# Patient Record
Sex: Male | Born: 1960 | Race: White | Hispanic: No | Marital: Married | Smoking: Former smoker
Health system: Southern US, Community
[De-identification: ages and names within clinical notes are randomized; demographics above are authoritative.]

## PROBLEM LIST (undated history)

## (undated) DIAGNOSIS — F32A Depression, unspecified: Secondary | ICD-10-CM

## (undated) DIAGNOSIS — I10 Essential (primary) hypertension: Secondary | ICD-10-CM

## (undated) DIAGNOSIS — K219 Gastro-esophageal reflux disease without esophagitis: Secondary | ICD-10-CM

## (undated) DIAGNOSIS — N189 Chronic kidney disease, unspecified: Secondary | ICD-10-CM

## (undated) DIAGNOSIS — G4730 Sleep apnea, unspecified: Secondary | ICD-10-CM

## (undated) DIAGNOSIS — F329 Major depressive disorder, single episode, unspecified: Secondary | ICD-10-CM

## (undated) DIAGNOSIS — K635 Polyp of colon: Secondary | ICD-10-CM

## (undated) DIAGNOSIS — E785 Hyperlipidemia, unspecified: Secondary | ICD-10-CM

## (undated) DIAGNOSIS — I4891 Unspecified atrial fibrillation: Secondary | ICD-10-CM

## (undated) HISTORY — DX: Hyperlipidemia, unspecified: E78.5

## (undated) HISTORY — DX: Chronic kidney disease, unspecified: N18.9

## (undated) HISTORY — DX: Polyp of colon: K63.5

## (undated) HISTORY — PX: APPENDECTOMY: SHX54

## (undated) HISTORY — DX: Sleep apnea, unspecified: G47.30

## (undated) HISTORY — PX: COLONOSCOPY: SHX174

## (undated) HISTORY — DX: Unspecified atrial fibrillation: I48.91

## (undated) HISTORY — PX: POLYPECTOMY: SHX149

## (undated) HISTORY — DX: Major depressive disorder, single episode, unspecified: F32.9

## (undated) HISTORY — DX: Depression, unspecified: F32.A

## (undated) HISTORY — DX: Essential (primary) hypertension: I10

---

## 2000-03-23 ENCOUNTER — Emergency Department (HOSPITAL_COMMUNITY): Admission: EM | Admit: 2000-03-23 | Discharge: 2000-03-23 | Payer: Self-pay | Admitting: Emergency Medicine

## 2001-03-03 ENCOUNTER — Encounter: Payer: Self-pay | Admitting: Emergency Medicine

## 2001-03-03 ENCOUNTER — Emergency Department (HOSPITAL_COMMUNITY): Admission: EM | Admit: 2001-03-03 | Discharge: 2001-03-03 | Payer: Self-pay

## 2003-12-26 ENCOUNTER — Encounter: Admission: RE | Admit: 2003-12-26 | Discharge: 2003-12-26 | Payer: Self-pay | Admitting: Internal Medicine

## 2006-05-13 DIAGNOSIS — I10 Essential (primary) hypertension: Secondary | ICD-10-CM

## 2006-05-13 HISTORY — DX: Essential (primary) hypertension: I10

## 2007-07-01 ENCOUNTER — Ambulatory Visit: Payer: Self-pay | Admitting: Gastroenterology

## 2007-07-08 ENCOUNTER — Ambulatory Visit: Payer: Self-pay | Admitting: Gastroenterology

## 2007-07-08 ENCOUNTER — Encounter: Payer: Self-pay | Admitting: Gastroenterology

## 2007-07-26 ENCOUNTER — Emergency Department (HOSPITAL_COMMUNITY): Admission: EM | Admit: 2007-07-26 | Discharge: 2007-07-26 | Payer: Self-pay | Admitting: Family Medicine

## 2008-08-30 ENCOUNTER — Encounter: Admission: RE | Admit: 2008-08-30 | Discharge: 2008-08-30 | Payer: Self-pay | Admitting: Orthopedic Surgery

## 2010-06-06 ENCOUNTER — Encounter: Payer: Self-pay | Admitting: Gastroenterology

## 2010-06-14 NOTE — Letter (Signed)
Summary: Colonoscopy Letter  [facility]  [address] [place], [place] [address]   Phone: [phone]  Fax: [phone]      June 06, 2010 MRN: [id number]   Norman Sims [address] [place], [place]  [address]   Dear [name],   According to your medical record, it is time for you to schedule a Colonoscopy. The American Cancer Society recommends this procedure as a method to detect early colon cancer. Patients with a family history of colon cancer, or a personal history of colon polyps or inflammatory bowel disease are at increased risk.  This letter has been generated based on the recommendations made at the time of your procedure. If you feel that in your particular situation this may no longer apply, please contact our office.  Please call our office at [phone] to schedule this appointment or to update your records at your earliest convenience.  Thank you for cooperating with [place] to provide you with the very best care possible.   Sincerely,  Judie Petit T. Russella Dar, M.D.  [facility] Gastroenterology Division [phone]

## 2010-07-09 ENCOUNTER — Encounter (INDEPENDENT_AMBULATORY_CARE_PROVIDER_SITE_OTHER): Payer: Self-pay | Admitting: *Deleted

## 2010-07-19 NOTE — Letter (Signed)
Summary: Pre Visit Letter Revised  [facility]  [address] [place], [place] [address]   Phone: [phone]  Fax: [phone]        07/09/2010 MRN: [id number] Norman Sims [address] [place], [place]  [address]             Procedure Date:  08/14/2010 @ 9:30   Recall colon-Dr. Russella Dar   Welcome to the Gastroenterology Division at [facility].    You are scheduled to see a nurse for your pre-procedure visit on 07/31/2010 at 9:00 on the 3rd floor at [facility], [address]  We ask that you try to arrive at our office 15 minutes prior to your appointment time to allow for check-in.  Please take a minute to review the attached form.  If you answer "Yes" to one or more of the questions on the first page, we ask that you call the person listed at your earliest opportunity.  If you answer "No" to all of the questions, please complete the rest of the form and bring it to your appointment.    Your nurse visit will consist of discussing your medical and surgical history, your immediate family medical history, and your medications.   If you are unable to list all of your medications on the form, please bring the medication bottles to your appointment and we will list them.  We will need to be aware of both prescribed and over the counter drugs.  We will need to know exact dosage information as well.    Please be prepared to read and sign documents such as consent forms, a financial agreement, and acknowledgement forms.  If necessary, and with your consent, a friend or relative is welcome to sit-in on the nurse visit with you.  Please bring your insurance card so that we may make a copy of it.  If your insurance requires a referral to see a specialist, please bring your referral form from your primary care physician.  No co-pay is required for this nurse visit.     If you cannot keep your appointment, please call [phone] to cancel or reschedule prior to  your appointment date.  This allows [place] the opportunity to schedule an appointment for another patient in need of care.    Thank you for choosing [facility] for your medical needs.  We appreciate the opportunity to care for you.  Please visit [place] at our website  to learn more about our practice.  Sincerely, The Gastroenterology Division

## 2010-07-31 ENCOUNTER — Ambulatory Visit (INDEPENDENT_AMBULATORY_CARE_PROVIDER_SITE_OTHER): Payer: BC Managed Care – PPO

## 2010-07-31 VITALS — Ht 71.0 in | Wt 255.2 lb

## 2010-07-31 DIAGNOSIS — Z1211 Encounter for screening for malignant neoplasm of colon: Secondary | ICD-10-CM

## 2010-07-31 MED ORDER — PEG-KCL-NACL-NASULF-NA ASC-C 100 G PO SOLR: 1.0000 | Freq: Once | ORAL | Status: AC

## 2010-07-31 NOTE — Patient Instructions (Signed)
Pt to pick up prep in 5 days 

## 2010-07-31 NOTE — Progress Notes (Signed)
Hold HCTZ am of procedure.

## 2010-08-10 NOTE — Progress Notes (Signed)
Previsit was done on 07/31/10; instructions given to pt

## 2010-08-13 ENCOUNTER — Encounter: Payer: Self-pay | Admitting: Gastroenterology

## 2010-08-14 ENCOUNTER — Ambulatory Visit (AMBULATORY_SURGERY_CENTER): Payer: BC Managed Care – PPO | Admitting: Gastroenterology

## 2010-08-14 ENCOUNTER — Encounter: Payer: Self-pay | Admitting: Gastroenterology

## 2010-08-14 ENCOUNTER — Other Ambulatory Visit: Payer: Self-pay | Admitting: Gastroenterology

## 2010-08-14 VITALS — BP 132/85 | HR 79 | Temp 98.4°F | Resp 18 | Ht 72.0 in | Wt 250.0 lb

## 2010-08-14 DIAGNOSIS — K5730 Diverticulosis of large intestine without perforation or abscess without bleeding: Secondary | ICD-10-CM

## 2010-08-14 DIAGNOSIS — D126 Benign neoplasm of colon, unspecified: Secondary | ICD-10-CM

## 2010-08-14 DIAGNOSIS — Z1211 Encounter for screening for malignant neoplasm of colon: Secondary | ICD-10-CM

## 2010-08-14 DIAGNOSIS — Z86010 Personal history of colonic polyps: Secondary | ICD-10-CM

## 2010-08-14 NOTE — Patient Instructions (Signed)
Reviewed discharge instructions    Education material given on polyps, diverticulosis, high fiber diet, and hemorrhoids  High fiber diet with liberal fluid intake to be followed  Pathology results should arrive in approximately 2 weeks via letter

## 2010-08-15 ENCOUNTER — Telehealth: Payer: Self-pay | Admitting: *Deleted

## 2010-08-15 NOTE — Telephone Encounter (Signed)
Left message on cell phone for pt to call if any concerns or questions.

## 2010-08-21 ENCOUNTER — Encounter: Payer: Self-pay | Admitting: Gastroenterology

## 2010-09-25 NOTE — Assessment & Plan Note (Signed)
[facility]                         GASTROENTEROLOGY OFFICE NOTE   NAME:Plush, Norman Sims                   MRN:          [id number]  DATE:07/01/2007                            DOB:          12/01/1960    PROBLEM:  Rectal bleeding.   HISTORY:  This is a pleasant 50 year old white male with history of  hypertension, hyperlipidemia, and depression who has not had prior GI  problems.  He says he started noticing bright blood intermittently with  bowel movements 3-4 months ago.  This has persisted but does not occur  on a daily basis.  He has not noticed any changes in his bowel habits,  but says that he does have some sense at times of incomplete bowel  evacuation.  He has also noted some lower abdominal grumbling or  growling at times which he had never noticed previously.  He denies any  abdominal pain, cramping, bloating, etc.  He has had some internal  rectal irritation intermittently as well.  He describes the blood that  he sees as a casing of blood over the stool again intermittently.  His  appetite has been fine.  His weight has been stable.  He denies any  nausea, heartburn, or indigestion.   CURRENT MEDICATIONS:  1. Zoloft 50 mg daily.  2. Seroquel 400 mg daily.  3. Triamterene/hydrochlorothiazide 37.5/25 daily.  4. Benazepril 20 mg daily.  5. Lipitor 20 mg 1/2 tablet daily.  6. Multivitamin daily.  7. Vitamin E b.i.d.  8. Aspirin 81 mg daily.   ALLERGIES:  No known drug allergies.   PAST MEDICAL HISTORY:  Hypertension  hyperlipidemia  depression   No prior surgeries.   SOCIAL HISTORY:  The patient is married and has two children.  He is the  owner of [employer].  He is a nonsmoker and drinks wine 2-3  glasses per week.   FAMILY HISTORY:  Negative for colon cancer, polyps, or inflammatory  bowel disease.  Father with history of coronary artery disease status  post CABG and aunts and uncles with diabetes.   REVIEW OF  SYSTEMS:  A review of systems is entirely negative other than  GI as listed above.  Please see the printed new patient form.   PHYSICAL EXAMINATION:  GENERAL:  A well-developed, white male in no  acute distress.  VITAL SIGNS:  Height is 5 feet 11 inches, weight 248, blood pressure  124/76, pulse 72.  HEENT:  Normocephalic and atraumatic.  EOMI.  PERRLA.  Sclerae  anicteric.  NECK:  Supple.  CARDIOVASCULAR:  Regular rate and rhythm with S1 and S2.  No murmurs,  rubs, or gallops.  LUNGS:  Clear to A&P.  ABDOMEN:  Soft.  Bowel sounds are active.  He is nontender.  There is no  palpable mass or hepatosplenomegaly.  No guarding or rebound.  Rectal  examination is not repeated.  This was recently hemoccult negative per  Dr. Waynard Edwards with probable internal hemorrhoid.   IMPRESSION:  50 year old white male with 3-4 month history of  intermittent hematochezia, rule out occult colon lesion, rule out  hemorrhoidal bleeding.   PLAN:  The patient will be scheduled for colonoscopy for further  evaluation.  The procedure was discussed in detail with the patient  including risks, benefits and alternatives and he consents to proceed.      Mike Gip, PA-C  Electronically Signed      Venita Lick. Russella Dar, MD, [facility]  Electronically Signed   AE/MedQ  DD: 07/01/2007  DT: 07/02/2007  Job #: [id number]   cc:   Loraine Leriche A. Perini, M.D.

## 2012-11-04 ENCOUNTER — Encounter (HOSPITAL_COMMUNITY): Payer: Self-pay | Admitting: Emergency Medicine

## 2012-11-04 ENCOUNTER — Emergency Department (HOSPITAL_COMMUNITY): Payer: BC Managed Care – PPO

## 2012-11-04 ENCOUNTER — Emergency Department (HOSPITAL_COMMUNITY)
Admission: EM | Admit: 2012-11-04 | Discharge: 2012-11-04 | Disposition: A | Discharge status: Home / Self Care | Payer: BC Managed Care – PPO | Attending: Emergency Medicine | Admitting: Emergency Medicine

## 2012-11-04 DIAGNOSIS — F3289 Other specified depressive episodes: Secondary | ICD-10-CM | POA: Insufficient documentation

## 2012-11-04 DIAGNOSIS — F329 Major depressive disorder, single episode, unspecified: Secondary | ICD-10-CM | POA: Insufficient documentation

## 2012-11-04 DIAGNOSIS — W268XXA Contact with other sharp object(s), not elsewhere classified, initial encounter: Secondary | ICD-10-CM | POA: Insufficient documentation

## 2012-11-04 DIAGNOSIS — S81832A Puncture wound without foreign body, left lower leg, initial encounter: Secondary | ICD-10-CM

## 2012-11-04 DIAGNOSIS — I10 Essential (primary) hypertension: Secondary | ICD-10-CM | POA: Insufficient documentation

## 2012-11-04 DIAGNOSIS — S81009A Unspecified open wound, unspecified knee, initial encounter: Secondary | ICD-10-CM | POA: Insufficient documentation

## 2012-11-04 DIAGNOSIS — Z79899 Other long term (current) drug therapy: Secondary | ICD-10-CM | POA: Insufficient documentation

## 2012-11-04 DIAGNOSIS — E785 Hyperlipidemia, unspecified: Secondary | ICD-10-CM | POA: Insufficient documentation

## 2012-11-04 DIAGNOSIS — Z87891 Personal history of nicotine dependence: Secondary | ICD-10-CM | POA: Insufficient documentation

## 2012-11-04 DIAGNOSIS — Y9389 Activity, other specified: Secondary | ICD-10-CM | POA: Insufficient documentation

## 2012-11-04 DIAGNOSIS — Z7982 Long term (current) use of aspirin: Secondary | ICD-10-CM | POA: Insufficient documentation

## 2012-11-04 DIAGNOSIS — Y9289 Other specified places as the place of occurrence of the external cause: Secondary | ICD-10-CM | POA: Insufficient documentation

## 2012-11-04 DIAGNOSIS — Y990 Civilian activity done for income or pay: Secondary | ICD-10-CM | POA: Insufficient documentation

## 2012-11-04 MED ORDER — CLINDAMYCIN HCL 150 MG PO CAPS: ORAL_CAPSULE | ORAL | Status: DC

## 2012-11-04 NOTE — ED Notes (Signed)
Patient c/o swelling in left lower leg after puncturing leg with a medal rod on Sunday. Puncture wound noted on anterior aspect of leg with red area noted on calf. Patient states "It almost went all the way through." Patient reports only slight soreness. Denies any fevers or drainage. Area slightly red above puncture wound with some warmth noted.

## 2012-11-04 NOTE — ED Provider Notes (Signed)
History    CSN: [id number] Arrival date & time 11/04/12  1544  First MD Initiated Contact with Patient 11/04/12 1606     Chief Complaint  Patient presents with  . Puncture Wound    HPI Pt was seen at 1610.  Per pt, c/o sudden onset and resolution of one episode of puncture wound to his left lower leg 3 days ago. Pt states he was out working in a field when he punctured his left lower leg with a "metal rod as thick as a pencil."  States he was able to remove the rod in its entirety "before it went all the way through" and immediately washed the area.  Pt states he has been cleaning the punctured area with betadine and covering it with a DSD since the injury.  Pt states after he "took a ride in the car today" he noticed his left foot and ankle area "swollen."  Denies any other injuries, no new injury, no pain at puncture site, no calf pain, no focal motor weakness, no tingling/numbness in extremity, no fevers, no CP/SOB, no rash, no drainage, no joints pain.    Td UTD Past Medical History  Diagnosis Date  . Hypertension 2008  . Hyperlipidemia   . Depression    History reviewed. No pertinent past surgical history.  Family History  Problem Relation Age of Onset  . Cancer Father    History  Substance Use Topics  . Smoking status: Former Smoker -- 3 years    Types: Cigars  . Smokeless tobacco: Never Used  . Alcohol Use: 3.0 oz/week    1 Glasses of wine, 4 Cans of beer per week    Review of Systems ROS: Statement: All systems negative except as marked or noted in the HPI; Constitutional: Negative for fever and chills. ; ; Eyes: Negative for eye pain, redness and discharge. ; ; ENMT: Negative for ear pain, hoarseness, nasal congestion, sinus pressure and sore throat. ; ; Cardiovascular: Negative for chest pain, palpitations, diaphoresis, dyspnea and peripheral edema. ; ; Respiratory: Negative for cough, wheezing and stridor. ; ; Gastrointestinal: Negative for nausea, vomiting,  diarrhea, abdominal pain, blood in stool, hematemesis, jaundice and rectal bleeding. . ; ; Genitourinary: Negative for dysuria, flank pain and hematuria. ; ; Musculoskeletal: Negative for back pain and neck pain. +left lower leg swelling and trauma.; ; Skin: +puncture wound, ecchymosis. Negative for pruritus, rash, abrasions, blisters, and skin lesion.; ; Neuro: Negative for headache, lightheadedness and neck stiffness. Negative for weakness, altered level of consciousness , altered mental status, extremity weakness, paresthesias, involuntary movement, seizure and syncope.       Allergies  Review of patient's allergies indicates no known allergies.  Home Medications   Current Outpatient Rx  Name  Route  Sig  Dispense  Refill  . aspirin 81 MG tablet   Oral   Take 81 mg by mouth daily.           Marland Kitchen atorvastatin (LIPITOR) 20 MG tablet   Oral   Take 20 mg by mouth daily. Take 1/2 tablet daily          . benazepril (LOTENSIN) 20 MG tablet   Oral   Take 20 mg by mouth daily.           . fish oil-omega-3 fatty acids 1000 MG capsule   Oral   Take 1,200 mg by mouth daily. Take 3 capsules daily          . hydrochlorothiazide 25 MG  tablet   Oral   Take 25 mg by mouth daily.           . Multiple Vitamin (MULTIVITAMIN) capsule   Oral   Take 1 capsule by mouth daily.           . QUEtiapine (SEROQUEL) 400 MG tablet   Oral   Take 400 mg by mouth at bedtime.           . sertraline (ZOLOFT) 50 MG tablet   Oral   Take 50 mg by mouth daily.           Marland Kitchen triamterene-hydrochlorothiazide (DYAZIDE) 37.5-25 MG per capsule   Oral   Take 1 capsule by mouth every morning.           . vitamin E 100 UNIT capsule   Oral   Take 100 Units by mouth 2 (two) times daily.            BP 126/79  Pulse 94  Temp(Src) 98.1 F (36.7 C) (Oral)  Resp 20  Ht 6' (1.829 m)  Wt 240 lb (108.863 kg)  BMI 32.54 kg/m2  SpO2 97%  Physical Exam 1615: Physical examination:  Nursing notes  reviewed; Vital signs and O2 SAT reviewed;  Constitutional: Well developed, Well nourished, Well hydrated, In no acute distress; Head:  Normocephalic, atraumatic; Eyes: EOMI, PERRL, No scleral icterus; ENMT: Mouth and pharynx normal, Mucous membranes moist; Neck: Supple, Full range of motion, No lymphadenopathy; Cardiovascular: Regular rate and rhythm, No gallop; Respiratory: Breath sounds clear & equal bilaterally, No rales, rhonchi, wheezes.  Speaking full sentences with ease, Normal respiratory effort/excursion; Chest: Nontender, Movement normal; Abdomen: Soft, Nontender, Nondistended, Normal bowel sounds; Genitourinary: No CVA tenderness; Extremities: Pulses normal, No left knee/ankle/foot tenderness. LLE muscles compartments soft. +left lower leg edema from foot to lower calf. No calf tenderness to palp. +approx 1cm diameter area puncture wound to anterior-lateral left lower leg with very faint surrounding erythema, no streaking, no drainage, no ecchymosis, no odor, no soft tissue crepitus. Posterior left lower leg with small round area of ecchymosis, no open wounds, no erythema, no soft tissue crepitus. Left foot warm/dry/good color, NMS intact.; Neuro: AA&Ox3, Major CN grossly intact.  Speech clear. Climbs on and off stretcher easily by himself. Gait steady. No gross focal motor or sensory deficits in extremities.; Skin: Color normal, Warm, Dry.   ED Course  Procedures      MDM  MDM Reviewed: previous chart, nursing note and vitals Interpretation: x-ray and ultrasound    Dg Tibia/fibula Left 11/04/2012   *RADIOLOGY REPORT*  Clinical Data: Swelling status post puncture wound  LEFT TIBIA AND FIBULA - 2 VIEW  Comparison: Prior radiographs of the left knee 07/26/2007  Findings: Diffuse soft tissue swelling with the skin thickening reticulation of the subcutaneous fat along the anterior, posterior and lateral aspect of the left lower leg.  Metallic BB is markedly puncture wound anteriorly and  posteriorly.  No evidence of underlying osseous injury or retained radiopaque foreign body.  No significant subcutaneous emphysema.  Bony mineralization is within normal limits.  There are mild degenerative changes at the knee joint.  IMPRESSION: Soft tissue edema versus cellulitis without evidence of underlying osseous injury, retained radiopaque foreign body or subcutaneous emphysema.   Original Report Authenticated By: Malachy Moan, M.D.   US Venous Img Lower Unilateral Left 11/04/2012   *RADIOLOGY REPORT*  Clinical Data: 52 year old male with swelling status post puncture wound.  LEFT LOWER EXTREMITY VENOUS DUPLEX ULTRASOUND  Technique:  Gray-scale sonography with graded compression, as well as color Doppler and duplex ultrasound, were performed to evaluate the deep venous system of the lower extremity from the level of the common femoral vein through the popliteal and proximal calf veins. Spectral Doppler was utilized to evaluate flow at rest and with distal augmentation maneuvers.  Comparison:  Left tib-fib radiographs 1622 hours the same day.  Findings: Normal compressibility of the left common femoral, superficial femoral, and popliteal veins is demonstrated, as well as the visualized proximal calf veins.  No filling defects to suggest DVT on grayscale or color Doppler imaging.  Doppler waveforms show normal direction of venous flow, normal respiratory phasicity and response to augmentation.  Visible greater saphenous vein appears patent.  IMPRESSION: No evidence of left lower extremity deep venous thrombosis.   Original Report Authenticated By: Erskine Speed, M.D.    1735:  Pt states he continues to "feel ok" and wants to go home now. Will rx PO clindamycin for mild early cellulitis. NMS exam intact, muscles compartments soft with strong palp pedal pulses present 3 days after injury; doubt compartment syndrome or arterial injury at this time.  Dx and testing d/w pt and family.  Questions answered.   Verb understanding, agreeable to d/c home with outpt f/u.         Laray Anger, DO 11/07/12 [phone]

## 2013-04-06 ENCOUNTER — Emergency Department (HOSPITAL_COMMUNITY): Payer: BC Managed Care – PPO

## 2013-04-06 ENCOUNTER — Encounter (HOSPITAL_COMMUNITY): Payer: Self-pay | Admitting: Emergency Medicine

## 2013-04-06 ENCOUNTER — Observation Stay (HOSPITAL_COMMUNITY)
Admission: EM | Admit: 2013-04-06 | Discharge: 2013-04-07 | Disposition: A | Discharge status: Home / Self Care | Payer: BC Managed Care – PPO | Attending: Cardiology | Admitting: Cardiology

## 2013-04-06 DIAGNOSIS — I4891 Unspecified atrial fibrillation: Secondary | ICD-10-CM | POA: Diagnosis present

## 2013-04-06 DIAGNOSIS — E785 Hyperlipidemia, unspecified: Secondary | ICD-10-CM | POA: Insufficient documentation

## 2013-04-06 DIAGNOSIS — R059 Cough, unspecified: Secondary | ICD-10-CM | POA: Insufficient documentation

## 2013-04-06 DIAGNOSIS — J3489 Other specified disorders of nose and nasal sinuses: Secondary | ICD-10-CM | POA: Insufficient documentation

## 2013-04-06 DIAGNOSIS — K219 Gastro-esophageal reflux disease without esophagitis: Secondary | ICD-10-CM | POA: Insufficient documentation

## 2013-04-06 DIAGNOSIS — F3289 Other specified depressive episodes: Principal | ICD-10-CM | POA: Insufficient documentation

## 2013-04-06 DIAGNOSIS — Z23 Encounter for immunization: Secondary | ICD-10-CM | POA: Insufficient documentation

## 2013-04-06 DIAGNOSIS — R630 Anorexia: Secondary | ICD-10-CM | POA: Insufficient documentation

## 2013-04-06 DIAGNOSIS — F329 Major depressive disorder, single episode, unspecified: Secondary | ICD-10-CM | POA: Insufficient documentation

## 2013-04-06 DIAGNOSIS — Z79899 Other long term (current) drug therapy: Secondary | ICD-10-CM | POA: Insufficient documentation

## 2013-04-06 DIAGNOSIS — R05 Cough: Secondary | ICD-10-CM | POA: Insufficient documentation

## 2013-04-06 DIAGNOSIS — R000 Tachycardia, unspecified: Secondary | ICD-10-CM | POA: Insufficient documentation

## 2013-04-06 DIAGNOSIS — Z87891 Personal history of nicotine dependence: Secondary | ICD-10-CM | POA: Insufficient documentation

## 2013-04-06 DIAGNOSIS — Z7982 Long term (current) use of aspirin: Secondary | ICD-10-CM | POA: Insufficient documentation

## 2013-04-06 DIAGNOSIS — I10 Essential (primary) hypertension: Secondary | ICD-10-CM

## 2013-04-06 DIAGNOSIS — Z8669 Personal history of other diseases of the nervous system and sense organs: Secondary | ICD-10-CM | POA: Insufficient documentation

## 2013-04-06 HISTORY — DX: Gastro-esophageal reflux disease without esophagitis: K21.9

## 2013-04-06 LAB — BASIC METABOLIC PANEL
BUN: 18 mg/dL (ref 6–23)
Calcium: 9 mg/dL (ref 8.4–10.5)
Chloride: 98 mEq/L (ref 96–112)
Creatinine, Ser: 0.82 mg/dL (ref 0.50–1.35)
GFR calc Af Amer: >90 mL/min (ref >90)
GFR calc non Af Amer: >90 mL/min (ref >90)
Glucose, Bld: 98 mg/dL (ref 70–99)

## 2013-04-06 LAB — CBC
Hemoglobin: 15.4 g/dL (ref 13.0–17.0)
MCH: 32.1 pg (ref 26.0–34.0)
MCHC: 35.2 g/dL (ref 30.0–36.0)
Platelets: 180 10*3/uL (ref 150–400)
RDW: 13.8 % (ref 11.5–15.5)
WBC: 5.3 10*3/uL (ref 4.0–10.5)

## 2013-04-06 LAB — PROTIME-INR
INR: 1.01 (ref 0.00–1.49)
Prothrombin Time: 13.1 seconds (ref 11.6–15.2)

## 2013-04-06 LAB — POCT I-STAT TROPONIN I: Troponin i, poc: 0 ng/mL (ref 0.00–0.08)

## 2013-04-06 LAB — HEPATIC FUNCTION PANEL
Alkaline Phosphatase: 80 U/L (ref 39–117)
Indirect Bilirubin: 0.6 mg/dL (ref 0.3–0.9)

## 2013-04-06 LAB — MAGNESIUM: Magnesium: 2.1 mg/dL (ref 1.5–2.5)

## 2013-04-06 LAB — D-DIMER, QUANTITATIVE: D-Dimer, Quant: 0.38 ug/mL-FEU (ref 0.00–0.48)

## 2013-04-06 MED ORDER — QUETIAPINE FUMARATE 400 MG PO TABS
400.0000 mg | ORAL_TABLET | Freq: Every day | ORAL | Status: DC
  Filled 2013-04-06 (×2): qty 1

## 2013-04-06 MED ORDER — ATORVASTATIN CALCIUM 20 MG PO TABS
20.0000 mg | ORAL_TABLET | Freq: Every morning | ORAL | Status: DC
  Administered 2013-04-06: 20 mg via ORAL
  Filled 2013-04-06: qty 1

## 2013-04-06 MED ORDER — OMEGA-3 FATTY ACIDS 1000 MG PO CAPS: 3000.0000 mg | ORAL_CAPSULE | Freq: Every morning | ORAL | Status: DC

## 2013-04-06 MED ORDER — SODIUM CHLORIDE 0.9 % IJ SOLN: 3.0000 mL | INTRAMUSCULAR | Status: DC | PRN

## 2013-04-06 MED ORDER — OFF THE BEAT BOOK
Freq: Once | Status: AC
  Administered 2013-04-06: 23:00:00
  Filled 2013-04-06: qty 1

## 2013-04-06 MED ORDER — ACETAMINOPHEN 325 MG PO TABS: 650.0000 mg | ORAL_TABLET | ORAL | Status: DC | PRN

## 2013-04-06 MED ORDER — ONDANSETRON HCL 4 MG/2ML IJ SOLN: 4.0000 mg | Freq: Four times a day (QID) | INTRAMUSCULAR | Status: DC | PRN

## 2013-04-06 MED ORDER — HYDROCHLOROTHIAZIDE 25 MG PO TABS: 25.0000 mg | ORAL_TABLET | Freq: Every morning | ORAL | Status: DC

## 2013-04-06 MED ORDER — ADULT MULTIVITAMIN W/MINERALS CH
1.0000 | ORAL_TABLET | Freq: Every day | ORAL | Status: DC
  Administered 2013-04-07: 1 via ORAL
  Filled 2013-04-06: qty 1

## 2013-04-06 MED ORDER — SODIUM CHLORIDE 0.9 % IV SOLN: 250.0000 mL | INTRAVENOUS | Status: DC | PRN

## 2013-04-06 MED ORDER — PANTOPRAZOLE SODIUM 40 MG PO TBEC
40.0000 mg | DELAYED_RELEASE_TABLET | Freq: Every day | ORAL | Status: DC
  Administered 2013-04-06: 40 mg via ORAL
  Filled 2013-04-06 (×2): qty 1

## 2013-04-06 MED ORDER — POTASSIUM CHLORIDE CRYS ER 20 MEQ PO TBCR
20.0000 meq | EXTENDED_RELEASE_TABLET | Freq: Every day | ORAL | Status: DC
  Administered 2013-04-06 – 2013-04-07 (×2): 20 meq via ORAL
  Filled 2013-04-06 (×2): qty 1

## 2013-04-06 MED ORDER — SODIUM CHLORIDE 0.9 % IJ SOLN: 3.0000 mL | Freq: Two times a day (BID) | INTRAMUSCULAR | Status: DC

## 2013-04-06 MED ORDER — MULTIVITAMINS PO CAPS: 1.0000 | ORAL_CAPSULE | Freq: Every morning | ORAL | Status: DC

## 2013-04-06 MED ORDER — TRIAMTERENE-HCTZ 37.5-25 MG PO CAPS
1.0000 | ORAL_CAPSULE | Freq: Every day | ORAL | Status: DC
  Administered 2013-04-07: 1 via ORAL
  Filled 2013-04-06: qty 1

## 2013-04-06 MED ORDER — SERTRALINE HCL 100 MG PO TABS
100.0000 mg | ORAL_TABLET | Freq: Every morning | ORAL | Status: DC
  Administered 2013-04-07: 100 mg via ORAL
  Filled 2013-04-06: qty 1

## 2013-04-06 MED ORDER — DILTIAZEM HCL 25 MG/5ML IV SOLN
10.0000 mg | Freq: Once | INTRAVENOUS | Status: AC
  Administered 2013-04-06: 10 mg via INTRAVENOUS
  Filled 2013-04-06: qty 5

## 2013-04-06 MED ORDER — OMEGA-3-ACID ETHYL ESTERS 1 G PO CAPS
3.0000 g | ORAL_CAPSULE | Freq: Every day | ORAL | Status: DC
  Administered 2013-04-07: 3 g via ORAL
  Filled 2013-04-06: qty 3

## 2013-04-06 MED ORDER — APIXABAN 5 MG PO TABS
5.0000 mg | ORAL_TABLET | Freq: Two times a day (BID) | ORAL | Status: DC
  Administered 2013-04-06 – 2013-04-07 (×2): 5 mg via ORAL
  Filled 2013-04-06 (×4): qty 1

## 2013-04-06 MED ORDER — NITROGLYCERIN 0.4 MG SL SUBL: 0.4000 mg | SUBLINGUAL_TABLET | SUBLINGUAL | Status: DC | PRN

## 2013-04-06 MED ORDER — VITAMIN D3 25 MCG (1000 UNIT) PO TABS
1000.0000 [IU] | ORAL_TABLET | Freq: Every morning | ORAL | Status: DC
  Administered 2013-04-07: 1000 [IU] via ORAL
  Filled 2013-04-06: qty 1

## 2013-04-06 MED ORDER — INFLUENZA VAC SPLIT QUAD 0.5 ML IM SUSP
0.5000 mL | INTRAMUSCULAR | Status: AC
  Administered 2013-04-07: 0.5 mL via INTRAMUSCULAR
  Filled 2013-04-06: qty 0.5

## 2013-04-06 MED ORDER — DILTIAZEM HCL 100 MG IV SOLR
5.0000 mg/h | INTRAVENOUS | Status: DC
  Administered 2013-04-06: 15 mg/h via INTRAVENOUS
  Administered 2013-04-06: 5 mg/h via INTRAVENOUS
  Administered 2013-04-07: 15 mg/h via INTRAVENOUS
  Filled 2013-04-06 (×4): qty 100

## 2013-04-06 NOTE — ED Notes (Signed)
Pharmacy notified of missing dose of medication

## 2013-04-06 NOTE — H&P (Signed)
History and Physical  Patient ID: Norman Sims MRN: [id number], DOB: 1960/10/07 Date of Encounter: 04/06/2013, 4:33 PM Primary Physician: Ezequiel Kayser, MD Primary Cardiologist: New  Chief Complaint: cough Reason for Admission: atrial fib RVR  HPI: [name] is a 52 y/o M with history of HTN, HLD, GERD, depression who presented to [facility] from [facility] with newly diagnosed rapid afib. He denies any prior cardiac history but reports prior stress test 3-4 yrs ago that was normal. For the past 2 weeks he's had a cough and sensation of chest congestion. The cough is occasionally productive of clear/white phlegm but tends to be dry/worse at night. He takes Zegerid for GERD which generally controls his reflux symptoms. Today his cough felt worse and he also began to feel some SOB particularly when walking. He went to [facility] and was found to be in rapid atrial fibrillation, rates 130s. He was transferred to [facility] and received 10mg  diltiazem bolus & gtt with subsequent improvement in HR 90's. He denies CP, palpitations, diaphoresis, syncope, LEE, fevers, chills. He had a similar cough/chest congestion episode last year that lasted about a month. Does endorse occasional orthopnea but not always consistent. Traveled to [place] 2 weeks ago but was getting sick before then. Also traveled to [place] 3 months ago and apparently had an injury where he had a rod go through his leg. He does not exercise, but walks quite a bit when he hunts and denies any exertional symptoms. BMET, CBC wnl. Currently comfortable. Not hypoxic or hypertensive.   Past Medical History  Diagnosis Date  . Hypertension 2008  . Hyperlipidemia   . Depression   . GERD (gastroesophageal reflux disease)      Most Recent Cardiac Studies: None in Epic. Pt reports normal stress test 3-4 yrs ago.   Surgical History:  Past Surgical History  Procedure Laterality Date  . Appendectomy       Home Meds: Prior to  Admission medications   Medication Sig Start Date End Date Taking? Authorizing Provider  amLODipine (NORVASC) 5 MG tablet Take 5 mg by mouth daily. 03/29/13  Yes Historical Provider, MD  aspirin 81 MG tablet Take 81 mg by mouth every morning.    Yes Historical Provider, MD  atorvastatin (LIPITOR) 20 MG tablet Take 20 mg by mouth every morning.    Yes Historical Provider, MD  benazepril (LOTENSIN) 20 MG tablet Take 20 mg by mouth every morning.    Yes Historical Provider, MD  cholecalciferol (VITAMIN D) 1000 UNITS tablet Take 1,000 Units by mouth every morning.   Yes Historical Provider, MD  fish oil-omega-3 fatty acids 1000 MG capsule Take 3,000 mg by mouth every morning. Take 3 capsules daily   Yes Historical Provider, MD  hydrochlorothiazide 25 MG tablet Take 25 mg by mouth every morning.    Yes Historical Provider, MD  Multiple Vitamin (MULTIVITAMIN) capsule Take 1 capsule by mouth every morning.    Yes Historical Provider, MD  QUEtiapine (SEROQUEL) 400 MG tablet Take 400 mg by mouth at bedtime.     Yes Historical Provider, MD  sertraline (ZOLOFT) 100 MG tablet Take 100 mg by mouth every morning.   Yes Historical Provider, MD  triamterene-hydrochlorothiazide (DYAZIDE) 37.5-25 MG per capsule Take 1 capsule by mouth every morning.     Yes Historical Provider, MD    Allergies: No Known Allergies  History   Social History  . Marital Status: Married    Spouse Name: N/A    Number of  Children: N/A  . Years of Education: N/A   Occupational History  .      General Contractor    Social History Main Topics  . Smoking status: Former Smoker -- 3 years    Types: Cigars  . Smokeless tobacco: Never Used     Comment: as a teenager  . Alcohol Use: 3.0 oz/week    1 Glasses of wine, 4 Cans of beer per week     Comment: 2 drinks/nightly  . Drug Use: No  . Sexual Activity: Not on file   Other Topics Concern  . Not on file   Social History Narrative  . No narrative on file     Family  History  Problem Relation Age of Onset  . Cancer Father   . Pneumonia Mother   . CAD Father     a. CABG age 57.  Marland Kitchen CAD      Neg hx in siblings    Review of Systems: General: negative for chills, fever, night sweats or weight changes.  Cardiovascular: negative for chest pain, edema, orthopnea, palpitations, paroxysmal nocturnal dyspnea, shortness of breath or dyspnea on exertion Dermatological: negative for rash Respiratory: negative for cough or wheezing Urologic: negative for hematuria Abdominal: negative for nausea, vomiting, diarrhea, bright red blood per rectum, melena, or hematemesis Neurologic: negative for visual changes, syncope, or dizziness All other systems reviewed and are otherwise negative except as noted above.  Labs:   Lab Results  Component Value Date   WBC 5.3 04/06/2013   HGB 15.4 04/06/2013   HCT 43.8 04/06/2013   MCV 91.3 04/06/2013   PLT 180 04/06/2013    Recent Labs Lab 04/06/13 1353  NA 136  K 3.5  CL 98  CO2 25  BUN 18  CREATININE 0.82  CALCIUM 9.0  GLUCOSE 98   Troponin 0.01  Radiology/Studies:  CXR: - Cardiac shadow is stable. Multiple old rib fractures are noted on the left with healing. No focal infiltrate, effusion or pneumothorax is noted. IMPRESSION: No acute abnormality seen.   EKG: atrial fib 133bpm with RVr, no acute ST-T changes F/u ECG to try to define rhythm better still appears coarse afib 98bpm no acute ST-T changes  Physical Exam: Blood pressure 118/71, pulse 84, temperature 98.3 F (36.8 C), temperature source Oral, resp. rate 18, height 6' (1.829 m), weight 270 lb 11.2 oz (122.789 kg), SpO2 96.00%. General: Well developed, well nourished WM in no acute distress. Head: Normocephalic, atraumatic, sclera non-icteric, no xanthomas, nares are without discharge.  Neck: Negative for carotid bruits. JVD not elevated. Lungs: Clear bilaterally to auscultation without wheezes, rales, or rhonchi. Breathing is unlabored. Heart:  Irregular, slightly tachycardic with S1 S2. No murmurs, rubs, or gallops appreciated. Abdomen: Soft, non-tender, non-distended with normoactive bowel sounds. No hepatomegaly. No rebound/guarding. No obvious abdominal masses. Msk:  Strength and tone appear normal for age. Extremities: No clubbing or cyanosis. No edema.  Distal pedal pulses are 2+ and equal bilaterally. Neuro: Alert and oriented X 3. No focal deficit. No facial asymmetry. Moves all extremities spontaneously. Psych:  Responds to questions appropriately with a normal affect.    ASSESSMENT AND PLAN:   1. New onset atrial fib with RVR - admit, continue IV diltiazem (dc home amlodipine), anticoagulate with apixaban 5mg  bid, check echo/TSH, d-dimer given recent long travel. Stop aspirin.  2. Persistent cough - unclear if this is the precipitant for afib, or the representing symptom. Doubt infection. Continue PPI to cover for reflux, remove ACEI for now,  check pBNP & echo, and treat atrial fib. If symptoms don't improve, may need further pulm eval to r/o other process. 3. HTN - controlled. See above re: med changes. 4. Hyperlipidemia - cont statin. Check lipids. 5. GERD - cont PPI. 6. Regular EtOH use - instructed to cut down.  Signed, Dayna Dunn PA-C 04/06/2013, 4:33 PM  As above, patient seen and examined. Briefly patient is a 52 year old male with hypertension, hyperlipidemia who presents with new onset atrial fibrillation. The patient has traveled to [place] twice recently and to [place] for hunting trips. Over the past 2 weeks he has noticed a cough productive of clear sputum. No fevers, chills, hemoptysis. His cough worsens with lying flat and improves with sitting up. He denies dyspnea on exertion, orthopnea, PND or pedal edema, chest pain or palpitations. He went to urgent care and was noted to be in atrial fibrillation and cardiology is now asked to evaluate. Electrocardiogram shows atrial fibrillation with a rapid ventricular  response. Plan to admit to telemetry. Control heart rate with Cardizem. Check echocardiogram for LV function and TSH. Embolic risk factor of hypertension only. The duration of his atrial fibrillation is unclear but is most likely 2-4 weeks. Begin apixaban 5 mg by mouth twice a day. If his LV function is normal and his rate is controlled tomorrow afternoon he can be discharged and we will plan elective cardioversion in approximately 4 weeks if atrial fibrillation persists. He is complaining of a cough. Discontinue ACE inhibitor. Follow his blood pressure with the addition of Cardizem and adjust regimen as needed. Check BNP. If elevated we'll plan gentle diuresis as certainly mild edema could be contributing to his cough. Note it increases with lying flat. Given recent travel will check d-dimer and if elevated will need CT to exclude pulmonary embolus. Patient consumes at least 2 alcoholic beverages per day. Question if this contributed to his atrial fibrillation. Have asked him to decrease his intake.  Olga Millers

## 2013-04-06 NOTE — ED Notes (Signed)
Pharmacy notified of missing dose. 

## 2013-04-06 NOTE — ED Notes (Signed)
Pt placed on 2L O2 [place]

## 2013-04-06 NOTE — ED Notes (Signed)
Pt c/o cough, CP with congestion and some yellow sputum; pt went to [facility] and sent here for tachycardia; pt sts some dizziness and SOB

## 2013-04-06 NOTE — ED Notes (Signed)
Pt verbalizes he has had a productive cough x 2 weeks, fatigue, and exertional [facility]

## 2013-04-06 NOTE — ED Provider Notes (Signed)
CSN: [id number]     Arrival date & time 04/06/13  1302 History   First MD Initiated Contact with Patient 04/06/13 1333     Chief Complaint  Patient presents with  . Chest Pain  . Cough  . Shortness of Breath   (Consider location/radiation/quality/duration/timing/severity/associated sxs/prior Treatment) HPI 52 year old male who comes in today with irregular heartbeat. He states he has had some cough for the past 2 weeks. [facility] that he had an irregular heartbeat. He came into the emergency department secondary to this. Felt like he has had some congestion and general decreased energy level. He denies chest pain although it is noted that it has been put in in the chief complaint. He states he has been assessed by cardiology in the past and it was found to have a normal workup. Past Medical History  Diagnosis Date  . Hypertension 2008  . Hyperlipidemia   . Depression    History reviewed. No pertinent past surgical history. Family History  Problem Relation Age of Onset  . Cancer Father    History  Substance Use Topics  . Smoking status: Former Smoker -- 3 years    Types: Cigars  . Smokeless tobacco: Never Used  . Alcohol Use: 3.0 oz/week    1 Glasses of wine, 4 Cans of beer per week    Review of Systems  Allergies  Review of patient's allergies indicates no known allergies.  Home Medications   Current Outpatient Rx  Name  Route  Sig  Dispense  Refill  . amLODipine (NORVASC) 5 MG tablet   Oral   Take 5 mg by mouth daily.         Marland Kitchen aspirin 81 MG tablet   Oral   Take 81 mg by mouth every morning.          Marland Kitchen atorvastatin (LIPITOR) 20 MG tablet   Oral   Take 10 mg by mouth every morning. Take 1/2 tablet daily         . benazepril (LOTENSIN) 20 MG tablet   Oral   Take 20 mg by mouth every morning.          . cholecalciferol (VITAMIN D) 1000 UNITS tablet   Oral   Take 1,000 Units by mouth every morning.         . clindamycin  (CLEOCIN) 150 MG capsule      3 tabs PO TID x 10 days   90 capsule   0   . fish oil-omega-3 fatty acids 1000 MG capsule   Oral   Take 3,000 mg by mouth every morning. Take 3 capsules daily         . hydrochlorothiazide 25 MG tablet   Oral   Take 25 mg by mouth every morning.          . Multiple Vitamin (MULTIVITAMIN) capsule   Oral   Take 1 capsule by mouth every morning.          Marland Kitchen QUEtiapine (SEROQUEL) 400 MG tablet   Oral   Take 400 mg by mouth at bedtime.           . sertraline (ZOLOFT) 100 MG tablet   Oral   Take 100 mg by mouth every morning.         . triamterene-hydrochlorothiazide (DYAZIDE) 37.5-25 MG per capsule   Oral   Take 1 capsule by mouth every morning.  BP 120/89  Pulse 133  Temp(Src) 98.3 F (36.8 C) (Oral)  Resp 18  Ht 6' (1.829 m)  Wt 270 lb 11.2 oz (122.789 kg)  BMI 36.71 kg/m2  SpO2 98% Physical Exam  Nursing note and vitals reviewed. Constitutional: He is oriented to person, place, and time. He appears well-developed and well-nourished.  HENT:  Head: Normocephalic and atraumatic.  Right Ear: External ear normal.  Left Ear: External ear normal.  Nose: Nose normal.  Mouth/Throat: Oropharynx is clear and moist.  Eyes: Conjunctivae and EOM are normal. Pupils are equal, round, and reactive to light.  Neck: Normal range of motion. Neck supple.  Cardiovascular: Normal heart sounds and intact distal pulses.  An irregularly irregular rhythm present. Tachycardia present.   Pulmonary/Chest: Effort normal and breath sounds normal. No respiratory distress. He has no wheezes. He exhibits no tenderness.  Abdominal: Soft. Bowel sounds are normal. He exhibits no distension and no mass. There is no tenderness. There is no guarding.  Musculoskeletal: Normal range of motion.  Neurological: He is alert and oriented to person, place, and time. He has normal reflexes. He exhibits normal muscle tone. Coordination normal.  Skin: Skin is warm  and dry.  Psychiatric: He has a normal mood and affect. His behavior is normal. Judgment and thought content normal.    ED Course  Procedures (including critical care time) Labs Review Labs Reviewed - No data to display Imaging Review No results found.  EKG Interpretation    Date/Time:  Tuesday April 06 2013 13:08:42 EST Ventricular Rate:  133 PR Interval:    QRS Duration: 100 QT Interval:  322 QTC Calculation: 479 R Axis:   30 Text Interpretation:  Atrial fibrillation with rapid ventricular response Abnormal ECG Confirmed by Braidon Chermak MD, Jumanah Hynson ([id number]) on 04/06/2013 2:51:21 PM           Results for orders placed during the hospital encounter of 04/06/13  BASIC METABOLIC PANEL      Result Value Range   Sodium 136  135 - 145 mEq/L   Potassium 3.5  3.5 - 5.1 mEq/L   Chloride 98  96 - 112 mEq/L   CO2 25  19 - 32 mEq/L   Glucose, Bld 98  70 - 99 mg/dL   BUN 18  6 - 23 mg/dL   Creatinine, Ser 1.61  0.50 - 1.35 mg/dL   Calcium 9.0  8.4 - 09.6 mg/dL   GFR calc non Af Amer >90  >90 mL/min   GFR calc Af Amer >90  >90 mL/min  CBC      Result Value Range   WBC 5.3  4.0 - 10.5 K/uL   RBC 4.80  4.22 - 5.81 MIL/uL   Hemoglobin 15.4  13.0 - 17.0 g/dL   HCT 04.5  40.9 - 81.1 %   MCV 91.3  78.0 - 100.0 fL   MCH 32.1  26.0 - 34.0 pg   MCHC 35.2  30.0 - 36.0 g/dL   RDW 91.4  78.2 - 95.6 %   Platelets 180  150 - 400 K/uL  POCT I-STAT TROPONIN I      Result Value Range   Troponin i, poc 0.00  0.00 - 0.08 ng/mL   Comment 3             MDM   52 y.o. Male with history of hypertension and hyperlipidemia presents today with new onset atrial fibrillation  patient is given Cardizem IV and heart rate has decreased from 130s to 1/10.  He has remained hemodynamically stable here in the emergency department. I have reviewed his labs and x-rays. I have spoken with [facility] cardiology and they will see the patient in the emergency department. Patient is aware of the plan.  Hilario Quarry,  MD 04/06/13 [phone]

## 2013-04-06 NOTE — ED Notes (Signed)
MD at bedside. 

## 2013-04-07 DIAGNOSIS — R059 Cough, unspecified: Secondary | ICD-10-CM

## 2013-04-07 DIAGNOSIS — R05 Cough: Secondary | ICD-10-CM

## 2013-04-07 DIAGNOSIS — I10 Essential (primary) hypertension: Secondary | ICD-10-CM

## 2013-04-07 DIAGNOSIS — E785 Hyperlipidemia, unspecified: Secondary | ICD-10-CM

## 2013-04-07 DIAGNOSIS — F329 Major depressive disorder, single episode, unspecified: Secondary | ICD-10-CM

## 2013-04-07 DIAGNOSIS — I517 Cardiomegaly: Secondary | ICD-10-CM

## 2013-04-07 LAB — LIPID PANEL
Cholesterol: 165 mg/dL (ref 0–200)
LDL Cholesterol: 78 mg/dL (ref 0–99)
Total CHOL/HDL Ratio: 4.5 RATIO
Triglycerides: 249 mg/dL — ABNORMAL HIGH (ref <150)
VLDL: 50 mg/dL — ABNORMAL HIGH (ref 0–40)

## 2013-04-07 LAB — BASIC METABOLIC PANEL
BUN: 19 mg/dL (ref 6–23)
CO2: 25 mEq/L (ref 19–32)
Calcium: 9 mg/dL (ref 8.4–10.5)
Chloride: 101 mEq/L (ref 96–112)
Creatinine, Ser: 0.89 mg/dL (ref 0.50–1.35)
GFR calc Af Amer: >90 mL/min (ref >90)
GFR calc non Af Amer: >90 mL/min (ref >90)
Potassium: 3.4 mEq/L — ABNORMAL LOW (ref 3.5–5.1)
Sodium: 138 mEq/L (ref 135–145)

## 2013-04-07 LAB — CBC
HCT: 43.2 % (ref 39.0–52.0)
MCH: 32.3 pg (ref 26.0–34.0)
MCV: 92.9 fL (ref 78.0–100.0)
RBC: 4.65 MIL/uL (ref 4.22–5.81)
RDW: 14.1 % (ref 11.5–15.5)
WBC: 4.6 10*3/uL (ref 4.0–10.5)

## 2013-04-07 LAB — HEMOGLOBIN A1C: Hgb A1c MFr Bld: 5.8 % — ABNORMAL HIGH (ref <5.7)

## 2013-04-07 LAB — TROPONIN I: Troponin I: <0.3 ng/mL (ref <0.30)

## 2013-04-07 MED ORDER — POTASSIUM CHLORIDE CRYS ER 20 MEQ PO TBCR
40.0000 meq | EXTENDED_RELEASE_TABLET | Freq: Once | ORAL | Status: AC
  Administered 2013-04-07: 20 meq via ORAL
  Filled 2013-04-07: qty 2

## 2013-04-07 MED ORDER — DILTIAZEM HCL ER COATED BEADS 240 MG PO CP24
240.0000 mg | ORAL_CAPSULE | Freq: Every day | ORAL | Status: DC
  Administered 2013-04-07: 240 mg via ORAL
  Filled 2013-04-07: qty 1

## 2013-04-07 MED ORDER — APIXABAN 5 MG PO TABS: 5.0000 mg | ORAL_TABLET | Freq: Two times a day (BID) | ORAL | Status: DC

## 2013-04-07 MED ORDER — DILTIAZEM HCL ER COATED BEADS 240 MG PO CP24: 240.0000 mg | ORAL_CAPSULE | Freq: Every day | ORAL | Status: DC

## 2013-04-07 MED ORDER — PANTOPRAZOLE SODIUM 40 MG PO TBEC
40.0000 mg | DELAYED_RELEASE_TABLET | Freq: Once | ORAL | Status: AC
  Administered 2013-04-07: 40 mg via ORAL

## 2013-04-07 NOTE — Care Management Note (Addendum)
  Page 1 of 1   04/07/2013     11:36:44 AM   CARE MANAGEMENT NOTE 04/07/2013  Patient:  Norman Sims, Norman Sims   Account Number:  [id number]  Date Initiated:  04/07/2013  Documentation initiated by:  Donato Schultz  Subjective/Objective Assessment:   Patient admitted with chest pain. PMH:  Hypertension, dx with new onset of afib.  Planned for d/c today on Eliquis     Action/Plan:   CM provided patient with 30 day free and copay cards for Eliquis.  [facility] to verify coverage: $25.00 co-pay; patient has been notified.   Patient notified that pharmacy is able to provide partial supply until Friday.   Anticipated DC Date:  04/07/2013   Anticipated DC Plan:  HOME/SELF CARE      DC Planning Services  CM consult  Medication Assistance      Choice offered to / List presented to:             Status of service:  Completed, signed off Medicare Important Message given?   (If response is "NO", the following Medicare IM given date fields will be blank) Date Medicare IM given:   Date Additional Medicare IM given:    Discharge Disposition:  HOME/SELF CARE  Per UR Regulation:  Reviewed for med. necessity/level of care/duration of stay  If discussed at Long Length of Stay Meetings, dates discussed:    Comments:

## 2013-04-07 NOTE — Progress Notes (Signed)
  Echocardiogram 2D Echocardiogram has been performed.  Norman Sims, Norman Sims 04/07/2013, 10:05 AM

## 2013-04-07 NOTE — Discharge Summary (Signed)
Norman Sims, Norman Sims 04/07/2013

## 2013-04-07 NOTE — Plan of Care (Signed)
Problem: Consults Goal: Atrial Arhythmia Patient Education (See Patient Education module for education specifics.) Outcome: Progressing Off the beat book given to pt  Problem: Phase I Progression Outcomes Goal: Ventricular heart rate < 120/min Outcome: Progressing Pt's HR currently in high 90s & low 100s. Pt is on a cardizem drip.

## 2013-04-07 NOTE — Discharge Summary (Signed)
CARDIOLOGY DISCHARGE SUMMARY   Patient ID: Norman Sims MRN: [id number] DOB/AGE: [birth year] 52 y.o.  Admit date: 04/06/2013 Discharge date: 04/07/2013  PCP: Ezequiel Kayser, MD Primary Cardiologist:  [facility]  Primary Discharge Diagnosis:    Atrial fibrillation with RVR - SR at d/c Secondary Discharge Diagnosis:    Cough   Hypertension   Hyperlipidemia   GERD (gastroesophageal reflux disease)   Hypokalemia  Procedures: Chest x-ray  Hospital Course: Norman Sims is a 52 y.o. male with no history of CAD or arrhythmia. He had a cough for several days, developed shortness of breath, and [facility] He was found to be in rapid atrial fibrillation. He was transferred to [facility] and admitted for further evaluation and treatment.  He spontaneously converted to sinus rhythm while on the diltiazem drip. The diltiazem was changed to the long-acting oral form and he will continue this as an outpatient. He was started on Eliquis for anticoagulation.  His chest x-ray did not show any pneumonia. He was afebrile and his white count was within normal limits, so no antibiotics were initiated.  A BNP was minimally elevated but there was no evidence of significant volume overload on exam or on chest x-ray. He is to continue on his PPI after discharge and follow up with primary care if his cough does not improve.  He was mildly hypokalemic and this was supplemented. He was on both HCTZ 25 mg and Dyazide prior to admission. The HCTZ is on hold. He is to continue the Dyazide. Since his diuretic dose is being decreased, he will not be discharged on potassium.  There was concern that his ACE inhibitor was causing the cough so it was discontinued. A d-dimer was checked given recent travel, but was within normal limits. Thyroid function tests were drawn and are pending at the time of discharge as is the hemoglobin A1c.  He was asked to decrease alcohol consumption.  A  lipid profile was performed and reviewed. His triglycerides are slightly elevated but other values are acceptable. No dose change on his statin was indicated.  On 04/07/2013, [name] was seen by Dr. Delton See. He was ambulating without chest pain or shortness of breath and maintaining sinus rhythm. Dr. Delton See reviewed all data and felt no further inpatient workup was indicated. He was considered stable for discharge, to follow up as an outpatient.  Labs:  Lab Results  Component Value Date   WBC 4.6 04/07/2013   HGB 15.0 04/07/2013   HCT 43.2 04/07/2013   MCV 92.9 04/07/2013   PLT 168 04/07/2013     Recent Labs Lab 04/06/13 2030 04/07/13 0452  NA  --  138  K  --  3.4*  CL  --  101  CO2  --  25  BUN  --  19  CREATININE  --  0.89  CALCIUM  --  9.0  PROT 7.2  --   BILITOT 0.8  --   ALKPHOS 80  --   ALT 50  --   AST 35  --   GLUCOSE  --  109*   Magnesium  Date Value Range Status  04/06/2013 2.1  1.5 - 2.5 mg/dL Final    Recent Labs  40/98/11 1959 04/07/13 0214 04/07/13 0816  TROPONINI <0.30 <0.30 <0.30   Lipid Panel     Component Value Date/Time   CHOL 165 04/07/2013 0452   TRIG 249* 04/07/2013 0452   HDL 37* 04/07/2013  0452   CHOLHDL 4.5 04/07/2013 0452   VLDL 50* 04/07/2013 0452   LDLCALC 78 04/07/2013 0452    Pro B Natriuretic peptide (BNP)  Date/Time Value Range Status  04/06/2013  7:59 PM 235.3* 0 - 125 pg/mL Final    Recent Labs  04/06/13 2030  INR 1.01   Lab Results  Component Value Date   DDIMER 0.38 04/06/2013   TSH, free T4 and hemoglobin A1c are pending at the time of dictation.   Radiology: Dg Chest Port 1 View 04/06/2013   CLINICAL DATA:  Hypertension and chest pain  EXAM: PORTABLE CHEST - 1 VIEW  COMPARISON:  08/28/2018  FINDINGS: Cardiac shadow is stable. Multiple old rib fractures are noted on the left with healing. No focal infiltrate, effusion or pneumothorax is noted.  IMPRESSION: No acute abnormality seen.   Electronically  Signed   By: Alcide Clever M.D.   On: 04/06/2013 16:42   EKG:  04/07/2013 Sinus rhythm, no acute ischemic changes Vent. rate 76 BPM PR interval 154 ms QRS duration 98 ms QT/QTc 416/468 ms P-R-T axes 66 28 33  04/06/2013 Atrial fibrillation, rapid ventricular response Vent. rate 133 BPM PR interval * ms QRS duration 100 ms QT/QTc 322/479 ms P-R-T axes * 30 43   FOLLOW UP PLANS AND APPOINTMENTS No Known Allergies   Medication List    STOP taking these medications       amLODipine 5 MG tablet  Commonly known as:  NORVASC     aspirin 81 MG tablet     benazepril 20 MG tablet  Commonly known as:  LOTENSIN     hydrochlorothiazide 25 MG tablet  Commonly known as:  HYDRODIURIL      TAKE these medications       apixaban 5 MG Tabs tablet  Commonly known as:  ELIQUIS  Take 1 tablet (5 mg total) by mouth 2 (two) times daily.     atorvastatin 20 MG tablet  Commonly known as:  LIPITOR  Take 20 mg by mouth every morning.     cholecalciferol 1000 UNITS tablet  Commonly known as:  VITAMIN D  Take 1,000 Units by mouth every morning.     diltiazem 240 MG 24 hr capsule  Commonly known as:  CARDIZEM CD  Take 1 capsule (240 mg total) by mouth daily.     fish oil-omega-3 fatty acids 1000 MG capsule  Take 3,000 mg by mouth every morning. Take 3 capsules daily     multivitamin capsule  Take 1 capsule by mouth every morning.     Omeprazole-Sodium Bicarbonate 20-1100 MG Caps capsule  Commonly known as:  ZEGERID  Take 1 capsule by mouth daily.     QUEtiapine 400 MG tablet  Commonly known as:  SEROQUEL  Take 400 mg by mouth at bedtime.     sertraline 100 MG tablet  Commonly known as:  ZOLOFT  Take 100 mg by mouth every morning.     triamterene-hydrochlorothiazide 37.5-25 MG per capsule  Commonly known as:  DYAZIDE  Take 1 capsule by mouth every morning.        Discharge Orders   Future Appointments Provider Department Dept Phone   04/13/2013 1:00 PM [facility] Echo  Echo 1 [facility] SITE 3 ECHO LAB [phone]   04/13/2013 2:00 PM Minda Meo, PA-C [facility] [employer] [phone]   Future Orders Complete By Expires   Diet - low sodium heart healthy  As directed  Increase activity slowly  As directed      Follow-up Information   Follow up with [facility] On 04/13/2013. (Echocardiogram (heart ultrasound) at 1:00 pm, please arrive 15 min early for paperwork. )    Contact information:   [address] [place] [address]       Follow up with Rick Duff, PA-C On 04/13/2013. (See for Dr. Jens Som at 2:00 pm)    Specialty:  Cardiology   Contact information:   [address] [place] [address] [phone]       BRING ALL MEDICATIONS WITH YOU TO FOLLOW UP APPOINTMENTS  Time spent with patient to include physician time: 36 min Signed: Theodore Demark, PA-C 04/07/2013, 9:55 AM Co-Sign MD

## 2013-04-07 NOTE — Progress Notes (Addendum)
Patient Name: Norman Sims Date of Encounter: 04/07/2013  Active Problems:   Atrial fibrillation with RVR   Cough   Hypertension   Hyperlipidemia   GERD (gastroesophageal reflux disease)   Length of Stay: 1  SUBJECTIVE  The patient denies any symptoms, eats breakfast.  CURRENT MEDS . apixaban  5 mg Oral BID  . atorvastatin  20 mg Oral q morning - 10a  . cholecalciferol  1,000 Units Oral q morning - 10a  . influenza vac split quadrivalent PF  0.5 mL Intramuscular Tomorrow-1000  . multivitamin with minerals  1 tablet Oral Daily  . omega-3 acid ethyl esters  3 g Oral Daily  . pantoprazole  40 mg Oral Q0600  . potassium chloride  20 mEq Oral Daily  . QUEtiapine  400 mg Oral QHS  . sertraline  100 mg Oral q morning - 10a  . sodium chloride  3 mL Intravenous Q12H  . triamterene-hydrochlorothiazide  1 capsule Oral Daily    OBJECTIVE  Filed Vitals:   04/06/13 1630 04/06/13 1830 04/06/13 1951 04/07/13 0518  BP: 118/71 139/67 141/83 105/65  Pulse: 84 63 90 81  Temp:   99.1 F (37.3 C) 98.5 F (36.9 C)  TempSrc:   Oral Oral  Resp: 18 15 18 18   Height:   5' 11.5" (1.816 m)   Weight:   260 lb 12.8 oz (118.298 kg) 260 lb 12.8 oz (118.298 kg)  SpO2: 96% 98% 97% 96%    Intake/Output Summary (Last 24 hours) at 04/07/13 0749 Last data filed at 04/07/13 0650  Gross per 24 hour  Intake    360 ml  Output    950 ml  Net   -590 ml   Filed Weights   04/06/13 1310 04/06/13 1951 04/07/13 0518  Weight: 270 lb 11.2 oz (122.789 kg) 260 lb 12.8 oz (118.298 kg) 260 lb 12.8 oz (118.298 kg)    PHYSICAL EXAM  General: Pleasant, NAD. Neuro: Alert and oriented X 3. Moves all extremities spontaneously. Psych: Normal affect. HEENT:  Normal  Neck: Supple without bruits or JVD. Lungs:  Resp regular and unlabored, CTA. Heart: RRR no s3, s4, or murmurs. Abdomen: Soft, non-tender, non-distended, BS + x 4.  Extremities: No clubbing, cyanosis or edema. DP/PT/Radials 2+ and equal  bilaterally.  Accessory Clinical Findings  CBC  Recent Labs  04/06/13 1353 04/07/13 0452  WBC 5.3 4.6  HGB 15.4 15.0  HCT 43.8 43.2  MCV 91.3 92.9  PLT 180 168   Basic Metabolic Panel  Recent Labs  04/06/13 1353 04/06/13 2030 04/07/13 0452  NA 136  --  138  K 3.5  --  3.4*  CL 98  --  101  CO2 25  --  25  GLUCOSE 98  --  109*  BUN 18  --  19  CREATININE 0.82  --  0.89  CALCIUM 9.0  --  9.0  MG  --  2.1  --    Liver Function Tests  Recent Labs  04/06/13 2030  AST 35  ALT 50  ALKPHOS 80  BILITOT 0.8  PROT 7.2  ALBUMIN 4.2   No results found for this basename: LIPASE, AMYLASE,  in the last 72 hours Cardiac Enzymes  Recent Labs  04/06/13 1959 04/07/13 0214  TROPONINI <0.30 <0.30   BNP No components found with this basename: POCBNP,  D-Dimer  Recent Labs  04/06/13 2030  DDIMER 0.38   Hemoglobin A1C No results found for this basename: HGBA1C,  in the last 72 hours Fasting Lipid Panel  Recent Labs  04/07/13 0452  CHOL 165  HDL 37*  LDLCALC 78  TRIG 629*  CHOLHDL 4.5   Thyroid Function Tests No results found for this basename: TSH, T4TOTAL, FREET3, T3FREE, THYROIDAB,  in the last 72 hours  Radiology/Studies  Dg Chest Port 1 View  04/06/2013   CLINICAL DATA:  Hypertension and chest pain  EXAM: PORTABLE CHEST - 1 VIEW  COMPARISON:  08/28/2018  FINDINGS: Cardiac shadow is stable. Multiple old rib fractures are noted on the left with healing. No focal infiltrate, effusion or pneumothorax is noted.  IMPRESSION: No acute abnormality seen.   Electronically Signed   By: Alcide Clever M.D.   On: 04/06/2013 16:42    TELE  A fib with RVR cardioverted into SR at 2 am    ASSESSMENT AND PLAN  1. New onset atrial fib with RVR - on iv IV diltiazem overnight and spontaneous cardioversion to SR at 2 am, we will switch to Diltiazem SR 240 mg PO daily discharge home, continue to anticoagulate with apixaban 5mg  bid, check echo/TSH, d-dimer given recent  long travel. Stop aspirin. Follow up in the clinic the next week  2. Persistent cough - unclear if this is the precipitant for afib, or the representing symptom. Doubt infection. Continue PPI to cover for reflux, remove ACEI for now, check pBNP & echo, and treat atrial fib. If symptoms don't improve, may need further pulm eval to r/o other process.  3. HTN - controlled. See above re: med changes.  4. Hyperlipidemia - cont statin. Check lipids.  5. GERD - cont PPI.  6. Regular EtOH use - instructed to cut down.    Signed, Tobias Alexander, H MD, [facility] 04/07/2013

## 2013-04-13 ENCOUNTER — Ambulatory Visit (INDEPENDENT_AMBULATORY_CARE_PROVIDER_SITE_OTHER): Payer: BC Managed Care – PPO | Admitting: Cardiology

## 2013-04-13 ENCOUNTER — Encounter: Payer: Self-pay | Admitting: Cardiology

## 2013-04-13 ENCOUNTER — Other Ambulatory Visit (HOSPITAL_COMMUNITY): Payer: BC Managed Care – PPO

## 2013-04-13 VITALS — BP 116/80 | HR 108 | Ht 71.5 in | Wt 267.1 lb

## 2013-04-13 DIAGNOSIS — I4891 Unspecified atrial fibrillation: Secondary | ICD-10-CM

## 2013-04-13 MED ORDER — DILTIAZEM HCL ER COATED BEADS 360 MG PO CP24: 360.0000 mg | ORAL_CAPSULE | Freq: Every day | ORAL | Status: DC

## 2013-04-13 NOTE — Progress Notes (Signed)
CARDIOLOGY OFFICE NOTE  Patient ID: Norman Sims MRN: [id number], DOB/AGE: 12/24/1960   Date of Visit: 04/13/2013  Primary Physician: Ezequiel Kayser, MD Primary Cardiologist: Olga Millers, MD Reason for Visit: Hospital follow-up  History of Present Illness  Norman Sims is a 52 y.o. male with HTN, dyslipidemia and recently diagnosed AF who presents today for hospital followup.   [facility]2013 with SOB and cough. He was found to have rapid atrial fibrillation and was sent to [facility]. He was admitted and started on IV diltiazem for rate control. Within 24 hours he spontaneously converted to SR. He was started on Eliquis and diltiazem. CXR showed no acute abnormality. D-dimer was negative. Troponin negative. TSH 8.3, free T4 1.1. An echo was done revealing normal LV function and no significant valvular abnormality. His cough was felt to be related to ACEI; therefore, this was discontinued. He was instructed to decrease his alcohol intake.  Since discharge, he reports he is doing well and has no complaints. he denies chest pain or shortness of breath. he denies palpitations, dizziness, near syncope or syncope. he denies LE swelling, orthopnea, PND or recent weight gain. he is compliant and tolerating medications without difficulty.  Past Medical History Past Medical History  Diagnosis Date  . Hypertension 2008  . Hyperlipidemia   . Depression   . GERD (gastroesophageal reflux disease)     Past Surgical History Past Surgical History  Procedure Laterality Date  . Appendectomy      Allergies/Intolerances No Known Allergies  Current Home Medications Current Outpatient Prescriptions  Medication Sig Dispense Refill  . apixaban (ELIQUIS) 5 MG TABS tablet Take 1 tablet (5 mg total) by mouth 2 (two) times daily.  60 tablet  11  . atorvastatin (LIPITOR) 20 MG tablet Take 20 mg by mouth every morning.       . cholecalciferol (VITAMIN D) 1000 UNITS  tablet Take 1,000 Units by mouth every morning.      . diltiazem (CARDIZEM CD) 240 MG 24 hr capsule Take 1 capsule (240 mg total) by mouth daily.  30 capsule  11  . fish oil-omega-3 fatty acids 1000 MG capsule Take 3,000 mg by mouth every morning.       . Multiple Vitamin (MULTIVITAMIN) capsule Take 1 capsule by mouth every morning.       Maxwell Caul Bicarbonate (ZEGERID) 20-1100 MG CAPS capsule Take 1 capsule by mouth daily.       . QUEtiapine (SEROQUEL) 400 MG tablet Take 400 mg by mouth at bedtime.        . sertraline (ZOLOFT) 100 MG tablet Take 100 mg by mouth every morning.      . triamterene-hydrochlorothiazide (DYAZIDE) 37.5-25 MG per capsule Take 1 capsule by mouth every morning.         No current facility-administered medications for this visit.    Social History Social History  . Marital Status: Married   Social History Main Topics  . Smoking status: Former Smoker -- 3 years    Types: Cigars  . Smokeless tobacco: Never Used     Comment: as a teenager  . Alcohol Use: 21.0 oz/week    7 Glasses of wine, 28 Cans of beer per week     Comment: 2 drinks/nightly  . Drug Use: No   Review of Systems General: No chills, fever, night sweats or weight changes Cardiovascular: No chest pain, dyspnea on exertion, edema, orthopnea, palpitations, paroxysmal nocturnal dyspnea Dermatological: No rash,  lesions or masses Respiratory: No cough, dyspnea Urologic: No hematuria, dysuria Abdominal: No nausea, vomiting, diarrhea, bright red blood per rectum, melena, or hematemesis Neurologic: No visual changes, weakness, changes in mental status All other systems reviewed and are otherwise negative except as noted above.  Physical Exam Vitals: Blood pressure 116/80, pulse 61, height 5' 11.5" (1.816 m), weight 267 lb 1.9 oz (121.165 kg), SpO2 99.00%.  General: Well developed, well appearing 52 y.o. male in no acute distress. HEENT: Normocephalic, atraumatic. EOMs intact. Sclera  nonicteric. Oropharynx clear.  Neck: Supple. No JVD. Lungs: Respirations regular and unlabored, CTA bilaterally. No wheezes, rales or rhonchi. Heart: Irregular. S1, S2 present. No murmurs, rub, S3 or S4. Abdomen: Soft, non-distended.  Extremities: No clubbing, cyanosis or edema. PT/Radials 2+ and equal bilaterally. Psych: Normal affect. Neuro: Alert and oriented X 3. Moves all extremities spontaneously.   Diagnostics Echocardiogram  Study Conclusions - Left ventricle: The cavity size was normal. Wall thickness was normal. The estimated ejection fraction was 55%. Wall motion was normal; there were no regional wall motion abnormalities. Doppler parameters are consistent with abnormal left ventricular relaxation (grade 1 diastolic dysfunction). - Aortic valve: There was no stenosis. - Mitral valve: No significant regurgitation. - Left atrium: The atrium was mildly dilated. - Right ventricle: The cavity size was normal. Systolic function was normal. - Right atrium: The atrium was mildly dilated. - Pulmonary arteries: No complete TR doppler jet so unable to estimate PA systolic pressure. - Inferior vena cava: The vessel was normal in size; the respirophasic diameter changes were in the normal range (= 50%); findings are consistent with normal central venous pressure. Impression: - Normal LV size with EF 55%. Normal RV size and systolic function. Mild biatrial enlargement. No significant valvular abnormalities. 12-lead ECG today - atrial fibrillation 108 bpm; QRS 96 msec; QT/QTc 362/485 msec  Assessment and Plan 1. Paroxysmal atrial fibrillation - in AF today, rate 108 bpm - continue diltiazem for rate control and will up-titrate to 360 mg once daily - continue Eliquis for stroke prevention - follow-up with Dr. Jens Som in 4 weeks to discuss options for AAD therapy +/- DCCV  This plan of care was discussed and formulated with Dr. Jens Som. Signed, Rick Duff,  PA-C 04/13/2013, 2:43 PM

## 2013-04-13 NOTE — Patient Instructions (Addendum)
Your physician has recommended you make the following change in your medication:   INCREASE YOUR DILTIAZEM 360 MG ONCE A DAY  Your physician recommends that you schedule a follow-up appointment in: 05-04-2013 WITH BROOKE

## 2013-05-03 ENCOUNTER — Other Ambulatory Visit: Payer: Self-pay | Admitting: *Deleted

## 2013-05-04 ENCOUNTER — Ambulatory Visit: Payer: BC Managed Care – PPO | Admitting: Cardiology

## 2013-05-10 ENCOUNTER — Encounter: Payer: Self-pay | Admitting: Cardiology

## 2013-05-25 ENCOUNTER — Encounter: Payer: Self-pay | Admitting: Cardiology

## 2013-05-25 ENCOUNTER — Ambulatory Visit (INDEPENDENT_AMBULATORY_CARE_PROVIDER_SITE_OTHER): Payer: BC Managed Care – PPO | Admitting: Cardiology

## 2013-05-25 ENCOUNTER — Other Ambulatory Visit: Payer: Self-pay | Admitting: *Deleted

## 2013-05-25 VITALS — BP 124/88 | HR 68 | Ht 72.0 in | Wt 275.0 lb

## 2013-05-25 DIAGNOSIS — G4730 Sleep apnea, unspecified: Secondary | ICD-10-CM

## 2013-05-25 DIAGNOSIS — I4891 Unspecified atrial fibrillation: Secondary | ICD-10-CM

## 2013-05-25 NOTE — Patient Instructions (Addendum)
Your physician has requested that you have a lexiscan myoview. For further information please visit [email or web address]. Please follow instruction sheet, as given.  Your physician has recommended that you have a sleep study. This test records several body functions during sleep, including: brain activity, eye movement, oxygen and carbon dioxide blood levels, heart rate and rhythm, breathing rate and rhythm, the flow of air through your mouth and nose, snoring, body muscle movements, and chest and belly movement.  Your physician recommends that you have keep your  appointment 07/08/13 with dr. Stanford Breed

## 2013-05-26 NOTE — Progress Notes (Signed)
Patient ID: KELDAN EPLIN MRN: [id number], DOB/AGE: [birth year]   Date of Visit: 05/25/2013  Primary Physician: Jerlyn Ly, MD Primary Cardiologist: Stanford Breed, MD Reason for Visit: Atrial fibrillation  History of Present Illness  Norman Sims is a 53 y.o. male with HTN, dyslipidemia and recently diagnosed AF who presents today for 4-week followup.   [facility] with SOB and cough. He was found to have rapid atrial fibrillation and was sent to [facility]. He was admitted and started on IV diltiazem for rate control. Within 24 hours he spontaneously converted to SR. He was started on Eliquis and diltiazem. CXR showed no acute abnormality. D-dimer was negative. Troponin negative. TSH 8.3, free T4 1.1. An echo was done revealing normal LV function and no significant valvular abnormality. His cough was felt to be related to ACEI; therefore, this was discontinued. He was instructed to decrease his alcohol intake.   At his hospital follow-up visit, his rate was elevated and diltiazem was up-titrated.Today he returns for follow-up. He reports "really not feeling that bad" however he has noticed some exercise intolerance and fatigue. He snores heavily and falls asleep easily during the day. He has never had a sleep study. He has no other complaints. He denies chest pain or shortness of breath. He denies palpitations, dizziness, near syncope or syncope. He denies LE swelling, orthopnea, PND or recent weight gain. He is compliant with medications.  Past Medical History Past Medical History  Diagnosis Date  . Hypertension 2008  . Hyperlipidemia   . Depression   . GERD (gastroesophageal reflux disease)     Past Surgical History Past Surgical History  Procedure Laterality Date  . Appendectomy      Allergies/Intolerances No Known Allergies  Current Home Medications Current Outpatient Prescriptions  Medication Sig Dispense Refill  . apixaban (ELIQUIS) 5 MG  TABS tablet Take 1 tablet (5 mg total) by mouth 2 (two) times daily.  60 tablet  11  . atorvastatin (LIPITOR) 20 MG tablet Take 20 mg by mouth every morning.       . cholecalciferol (VITAMIN D) 1000 UNITS tablet Take 1,000 Units by mouth every morning.      . diltiazem (CARDIZEM CD) 360 MG 24 hr capsule Take 1 capsule (360 mg total) by mouth daily.  30 capsule  1  . fish oil-omega-3 fatty acids 1000 MG capsule Take 3,000 mg by mouth every morning.       . Multiple Vitamin (MULTIVITAMIN) capsule Take 1 capsule by mouth every morning.       [name] (ZEGERID) 20-1100 MG CAPS capsule Take 1 capsule by mouth daily.       . QUEtiapine (SEROQUEL) 400 MG tablet Take 400 mg by mouth at bedtime.        . sertraline (ZOLOFT) 100 MG tablet Take 100 mg by mouth every morning.      . triamterene-hydrochlorothiazide (DYAZIDE) 37.5-25 MG per capsule Take 1 capsule by mouth every morning.         No current facility-administered medications for this visit.    Social History History   Social History  . Marital Status: Married    Spouse Name: N/A    Number of Children: N/A  . Years of Education: N/A   Occupational History  .      General Contractor    Social History Main Topics  . Smoking status: Former Smoker -- 3 years    Types: Cigars  . Smokeless  tobacco: Never Used     Comment: as a teenager  . Alcohol Use: 21.0 oz/week    7 Glasses of wine, 28 Cans of beer per week     Comment: 2 drinks/nightly  . Drug Use: No  . Sexual Activity: Not on file   Other Topics Concern  . Not on file   Social History Narrative  . No narrative on file     Review of Systems General: No chills, fever, night sweats or weight changes Cardiovascular: No chest pain, dyspnea on exertion, edema, orthopnea, palpitations, paroxysmal nocturnal dyspnea Dermatological: No rash, lesions or masses Respiratory: No cough, dyspnea Urologic: No hematuria, dysuria Abdominal: No nausea, vomiting,  diarrhea, bright red blood per rectum, melena, or hematemesis Neurologic: No visual changes, weakness, changes in mental status All other systems reviewed and are otherwise negative except as noted above.  Physical Exam Vitals: Blood pressure 124/88, pulse 68, height 6' (1.829 m), weight 275 lb (124.739 kg).  General: Well developed, well appearing 53 y.o. male in no acute distress. HEENT: Normocephalic, atraumatic. EOMs intact. Sclera nonicteric. Oropharynx clear.  Neck: Supple. No JVD. Lungs: Respirations regular and unlabored, CTA bilaterally. No wheezes, rales or rhonchi. Heart: Irregular. S1, S2 present. No murmurs, rub, S3 or S4. Abdomen: Soft, non-distended.  Extremities: No clubbing, cyanosis or edema. PT/Radials 2+ and equal bilaterally. Psych: Normal affect. Neuro: Alert and oriented X 3. Moves all extremities spontaneously.   Diagnostics Echocardiogram Nov 2014 Study Conclusions - Left ventricle: The cavity size was normal. Wall thickness was normal. The estimated ejection fraction was 55%. Wall motion was normal; there were no regional wall motion abnormalities. Doppler parameters are consistent with abnormal left ventricular relaxation (grade 1 diastolic dysfunction). - Aortic valve: There was no stenosis. - Mitral valve: No significant regurgitation. - Left atrium: The atrium was mildly dilated. - Right ventricle: The cavity size was normal. Systolic function was normal. - Right atrium: The atrium was mildly dilated. - Pulmonary arteries: No complete TR doppler jet so unable to estimate PA systolic pressure. - Inferior vena cava: The vessel was normal in size; the respirophasic diameter changes were in the normal range (= 50%); findings are consistent with normal central venous pressure. Impression: - Normal LV size with EF 55%. Normal RV size and systolic function. Mild biatrial enlargement. No significant valvular abnormalities.  12-lead ECG today - coarse  atrial fibrillation at 99 bpm; QRS 102, QT/QTc 376/482; no ST-T wave abnormalities  Assessment and Plan 1. Persistent atrial fibrillation  - rate controlled - continue diltiazem 360 mg once daily for rate control  - continue Eliquis for stroke prevention - obtain Lexiscan Myoview for cardiac risk stratification - refer for sleep study   - follow-up with Dr. Stanford Breed in 2 weeks to discuss options for AAD therapy +/- DCCV   Signed, Ileene Hutchinson, PA-C 05/26/2013, 8:56 PM

## 2013-06-03 ENCOUNTER — Encounter (HOSPITAL_COMMUNITY): Payer: BC Managed Care – PPO

## 2013-06-07 ENCOUNTER — Ambulatory Visit: Payer: BC Managed Care – PPO | Admitting: Cardiology

## 2013-06-07 ENCOUNTER — Other Ambulatory Visit: Payer: Self-pay | Admitting: Cardiology

## 2013-06-15 ENCOUNTER — Ambulatory Visit (HOSPITAL_COMMUNITY): Payer: BC Managed Care – PPO | Attending: Cardiology | Admitting: Radiology

## 2013-06-15 VITALS — BP 115/96 | HR 127 | Ht 72.0 in | Wt 265.0 lb

## 2013-06-15 DIAGNOSIS — E785 Hyperlipidemia, unspecified: Secondary | ICD-10-CM | POA: Insufficient documentation

## 2013-06-15 DIAGNOSIS — I4891 Unspecified atrial fibrillation: Secondary | ICD-10-CM

## 2013-06-15 DIAGNOSIS — Z8249 Family history of ischemic heart disease and other diseases of the circulatory system: Secondary | ICD-10-CM | POA: Insufficient documentation

## 2013-06-15 DIAGNOSIS — I10 Essential (primary) hypertension: Secondary | ICD-10-CM | POA: Insufficient documentation

## 2013-06-15 MED ORDER — TECHNETIUM TC 99M SESTAMIBI GENERIC - CARDIOLITE
33.0000 | Freq: Once | INTRAVENOUS | Status: AC | PRN
  Administered 2013-06-15: 33 via INTRAVENOUS

## 2013-06-15 MED ORDER — REGADENOSON 0.4 MG/5ML IV SOLN
0.4000 mg | Freq: Once | INTRAVENOUS | Status: AC
  Administered 2013-06-15: 0.4 mg via INTRAVENOUS

## 2013-06-15 MED ORDER — TECHNETIUM TC 99M SESTAMIBI GENERIC - CARDIOLITE
11.0000 | Freq: Once | INTRAVENOUS | Status: AC | PRN
  Administered 2013-06-15: 11 via INTRAVENOUS

## 2013-06-15 NOTE — Progress Notes (Signed)
[facility] [address] [place], [place] [address] [phone]    Cardiology Nuclear Med Study  Norman Sims is a 53 y.o. male     MRN : [id number]     DOB: Jun 08, 1960  Procedure Date: 06/15/2013  Nuclear Med Background Indication for Stress Test:  Evaluation for Ischemia, and Patient seen in hospital on 04-06-13 for Atrial Fibrillation> converted to NSR> AFIB rate controlled, enzymes negative History:  No known CAD, Echo 2014 EF 55%, Afib Cardiac Risk Factors: Family History - CAD, Hypertension and Lipids  Symptoms:  None indicated   Nuclear Pre-Procedure Caffeine/Decaff Intake:  None > 12 hrs NPO After: 8:30am   Lungs:  clear O2 Sat: 97% on room air. IV 0.9% NS with Angio Cath:  22g  IV Site: R Antecubital x 1, tolerated well IV Started by:  Irven Baltimore, RN  Chest Size (in):  48 Cup Size: n/a  Height: 6' (1.829 m)  Weight:  265 lb (120.203 kg)  BMI:  Body mass index is 35.93 kg/(m^2). Tech Comments:  Took Cardizem this am    Nuclear Med Study 1 or 2 day study: 1 day  Stress Test Type:  Lexiscan  Reading MD: N/A  Order Authorizing Provider:  Kirk Ruths, MD  Resting Radionuclide: Technetium 78m Sestamibi  Resting Radionuclide Dose: 10.9 mCi   Stress Radionuclide:  Technetium 18m Sestamibi  Stress Radionuclide Dose: 33.0 mCi           Stress Protocol Rest HR: 127 Stress HR: 131  Rest BP: 115/96 Stress BP: 118/91  Exercise Time (min): n/a METS: n/a           Dose of Adenosine (mg):  n/a Dose of Lexiscan: 0.4 mg  Dose of Atropine (mg): n/a Dose of Dobutamine: n/a mcg/kg/min (at max HR)  Stress Test Technologist: Glade Lloyd, BS-ES  Nuclear Technologist:  Charlton Amor, CNMT     Rest Procedure:  Myocardial perfusion imaging was performed at rest 45 minutes following the intravenous administration of Technetium 55m Sestamibi. Rest ECG: Atrial Fibrilliation with RVR  Stress Procedure:  The patient received IV Lexiscan 0.4  mg over 15-seconds.  Technetium 44m Sestamibi injected at 30-seconds.  Quantitative spect images were obtained after a 45 minute delay.  During the infusion of Lexiscan, the patient complained of SOB and feeling flushed.  Symptoms resolved in recovery.  Stress ECG: No significant change from baseline ECG  QPS Raw Data Images:  Normal; no motion artifact; normal heart/lung ratio. Stress Images:  Normal homogeneous uptake in all areas of the myocardium. Rest Images:  Normal homogeneous uptake in all areas of the myocardium. Subtraction (SDS):  No evidence of ischemia. Transient Ischemic Dilatation (Normal <1.22):  0.96 Lung/Heart Ratio (Normal <0.45):  0.33  Quantitative Gated Spect Images QGS EDV:  n/a ml QGS ESV:  n/a ml  Impression Exercise Capacity:  Lexiscan with no exercise. BP Response:  Normal blood pressure response. Clinical Symptoms:  There is dyspnea. ECG Impression:  No significant ST segment change suggestive of ischemia. Comparison with Prior Nuclear Study: No previous nuclear study performed  Overall Impression:  Normal stress nuclear study.  LV Ejection Fraction: study not gated.  LV Wall Motion:  Study not gated.  Ena Dawley, H

## 2013-06-24 ENCOUNTER — Encounter: Payer: Self-pay | Admitting: Gastroenterology

## 2013-06-28 ENCOUNTER — Encounter (HOSPITAL_BASED_OUTPATIENT_CLINIC_OR_DEPARTMENT_OTHER): Payer: BC Managed Care – PPO

## 2013-07-01 ENCOUNTER — Ambulatory Visit (HOSPITAL_BASED_OUTPATIENT_CLINIC_OR_DEPARTMENT_OTHER): Payer: BC Managed Care – PPO

## 2013-07-05 ENCOUNTER — Ambulatory Visit (HOSPITAL_BASED_OUTPATIENT_CLINIC_OR_DEPARTMENT_OTHER): Payer: BC Managed Care – PPO | Attending: Cardiology | Admitting: Radiology

## 2013-07-05 VITALS — Ht 71.0 in | Wt 270.0 lb

## 2013-07-05 DIAGNOSIS — I4891 Unspecified atrial fibrillation: Secondary | ICD-10-CM | POA: Insufficient documentation

## 2013-07-05 DIAGNOSIS — G4733 Obstructive sleep apnea (adult) (pediatric): Secondary | ICD-10-CM | POA: Insufficient documentation

## 2013-07-05 DIAGNOSIS — G4730 Sleep apnea, unspecified: Secondary | ICD-10-CM

## 2013-07-08 ENCOUNTER — Ambulatory Visit: Payer: BC Managed Care – PPO | Admitting: Cardiology

## 2013-07-08 DIAGNOSIS — G4730 Sleep apnea, unspecified: Secondary | ICD-10-CM

## 2013-07-08 NOTE — Sleep Study (Signed)
   NAME: Norman Sims DATE OF BIRTH:  December 10, 1960 MEDICAL RECORD NUMBER [id number]  LOCATION: [facility]  PHYSICIAN: YOUNG,CLINTON D  DATE OF STUDY: 07/05/2013  SLEEP STUDY TYPE: Nocturnal Polysomnogram               REFERRING PHYSICIAN: Edmisten, Brooke O, PA-C  INDICATION FOR STUDY: Hypersomnia with sleep apnea  EPWORTH SLEEPINESS SCORE:   7/24 HEIGHT: 5\' 11"  (180.3 cm)  WEIGHT: 270 lb (122.471 kg)    Body mass index is 37.67 kg/(m^2).  NECK SIZE: 17.5 in.  MEDICATIONS: Charted for review  SLEEP ARCHITECTURE: Split study protocol. During the diagnostic phase, total sleep time 139 minutes with sleep efficiency 80.6%. Stage I was 23.4%, stage II 76.6%, stages 3 and REM were absent. Sleep latency 33 minutes, awake after sleep onset 1.5 minutes, arousal index 49.2. Bedtime medication: apixaban, fish oil, Seroquel  RESPIRATORY DATA: Apnea hypopnea index (AHI) 48.3 per hour. 112 total events scored including 100 obstructive apneas, 2 mixed apneas, 10 hypopneas. Events were seen in all sleep positions, especially supine. The CPAP was titrated to 15 CWP, AHI 7 per hour reflecting residual central apneas. Obstructive events were prevented. He wore a medium [employer] Simplus fullface mask with heated humidifier.  OXYGEN DATA: Moderate to loud snoring before CPAP with oxygen desaturation to a nadir of 77% on room air. With CPAP control, snoring was prevented and mean oxygen saturation of 93.5% on room air.  CARDIAC DATA: Atrial fibrillation with mean heart rate 116 per minute and occasional PVC or aberrant beat.  MOVEMENT/PARASOMNIA: No significant movement disturbance. A few limb jerks did not affect sleep. No bathroom trips.  IMPRESSION/ RECOMMENDATION:   1) Severe obstructive sleep apnea/hypopnea syndrome, AHI 48.3 per hour with events in all positions, especially supine. Moderate to loud snoring with oxygen desaturation to a nadir of 77% on room air.  2) Successful  CPAP titration to 15 CWP, AHI 7.1 per hour, reflecting a few residual central apneas. He wore a [employer] Simplus fullface mask with heated humidifier. Snoring was prevented and mean oxygen saturation held at 93.5% on room air with CPAP  Signed Baird Lyons M.D. Deneise Lever Diplomate, American Board of Sleep Medicine  ELECTRONICALLY SIGNED ON:  07/08/2013, 11:38 AM [place] PH: [phone]   FX: [phone] [place]

## 2013-07-22 ENCOUNTER — Ambulatory Visit: Payer: BC Managed Care – PPO | Admitting: Physician Assistant

## 2013-07-26 ENCOUNTER — Ambulatory Visit (INDEPENDENT_AMBULATORY_CARE_PROVIDER_SITE_OTHER): Payer: BC Managed Care – PPO | Admitting: Cardiology

## 2013-07-26 ENCOUNTER — Encounter: Payer: Self-pay | Admitting: Cardiology

## 2013-07-26 ENCOUNTER — Other Ambulatory Visit: Payer: Self-pay | Admitting: Cardiology

## 2013-07-26 ENCOUNTER — Other Ambulatory Visit: Payer: Self-pay | Admitting: *Deleted

## 2013-07-26 ENCOUNTER — Encounter: Payer: Self-pay | Admitting: *Deleted

## 2013-07-26 VITALS — BP 140/84 | HR 120 | Ht 71.0 in | Wt 276.0 lb

## 2013-07-26 DIAGNOSIS — I4891 Unspecified atrial fibrillation: Secondary | ICD-10-CM

## 2013-07-26 DIAGNOSIS — E876 Hypokalemia: Secondary | ICD-10-CM

## 2013-07-26 DIAGNOSIS — I10 Essential (primary) hypertension: Secondary | ICD-10-CM

## 2013-07-26 LAB — BASIC METABOLIC PANEL
BUN: 19 mg/dL (ref 6–23)
CHLORIDE: 103 meq/L (ref 96–112)
CO2: 30 mEq/L (ref 19–32)
CREATININE: 0.9 mg/dL (ref 0.4–1.5)
Calcium: 9 mg/dL (ref 8.4–10.5)
GFR: 96.94 mL/min (ref >60.00)
Glucose, Bld: 117 mg/dL — ABNORMAL HIGH (ref 70–99)
Potassium: 3.4 mEq/L — ABNORMAL LOW (ref 3.5–5.1)
Sodium: 140 mEq/L (ref 135–145)

## 2013-07-26 LAB — TSH: TSH: 2.49 u[IU]/mL (ref 0.35–5.50)

## 2013-07-26 MED ORDER — FLECAINIDE ACETATE 50 MG PO TABS: 50.0000 mg | ORAL_TABLET | Freq: Two times a day (BID) | ORAL | Status: DC

## 2013-07-26 MED ORDER — POTASSIUM CHLORIDE CRYS ER 20 MEQ PO TBCR: 20.0000 meq | EXTENDED_RELEASE_TABLET | Freq: Every day | ORAL | Status: DC

## 2013-07-26 MED ORDER — METOPROLOL SUCCINATE ER 25 MG PO TB24: 25.0000 mg | ORAL_TABLET | Freq: Every day | ORAL | Status: DC

## 2013-07-26 NOTE — Assessment & Plan Note (Addendum)
Patient remains in atrial fibrillation. Heart rate elevated. Continue Cardizem. Add Toprol 25 mg daily. Embolic risk factor of hypertension. Continue apixaban. We discussed options today including rate control versus rhythm control. He does have some symptoms. I would like to try and maintain sinus rhythm. Begin flecainide 50 mg by mouth twice a day. Proceed with cardioversion in 3 days. Note he has been taking his anticoagulant appropriately for greater than 3 weeks. Hopefully he will hold sinus rhythm with flecainide. Check exercise treadmill one week after initiation to exclude exercise-induced arrhythmia. He consumes 6 beers per night; question contribution to atrial fibrillation. I have asked him to decrease this. Previous TSH mildly increased. Repeat. Check potassium and renal function.

## 2013-07-26 NOTE — Progress Notes (Signed)
    HPI: FU atrial fibrillation. [facility]2013 with SOB and cough. He was found to have rapid atrial fibrillation and was sent to [facility]. He was admitted and started on IV diltiazem for rate control. Within 24 hours he spontaneously converted to SR. He was started on Eliquis and diltiazem. CXR showed no acute abnormality. D-dimer was negative. Troponin negative. TSH 8.3, free T4 1.1. An echo was done revealing normal LV function and no significant valvular abnormality; mild biatrial enlargement. His cough was felt to be related to ACEI; therefore, this was discontinued. He was instructed to decrease his alcohol intake. Nuclear study in February of 2015 showed normal perfusion. Study not gated. Since he was last seen he has occasional minimal dyspnea on exertion and mild orthopnea. No chest pain. Occasional mild palpitations.   Current Outpatient Prescriptions  Medication Sig Dispense Refill  . apixaban (ELIQUIS) 5 MG TABS tablet Take 1 tablet (5 mg total) by mouth 2 (two) times daily.  60 tablet  11  . atorvastatin (LIPITOR) 20 MG tablet Take 20 mg by mouth every morning.       . cholecalciferol (VITAMIN D) 1000 UNITS tablet Take 1,000 Units by mouth every morning.      . DILTIAZEM HCL CD 360 MG 24 hr capsule TAKE 1 CAPSULE (360 MG TOTAL) BY MOUTH DAILY.  30 capsule  1  . fish oil-omega-3 fatty acids 1000 MG capsule Take 3,000 mg by mouth every morning.       . Multiple Vitamin (MULTIVITAMIN) capsule Take 1 capsule by mouth every morning.       . Omeprazole-Sodium Bicarbonate (ZEGERID) 20-1100 MG CAPS capsule Take 1 capsule by mouth daily.       . QUEtiapine (SEROQUEL) 400 MG tablet Take 400 mg by mouth at bedtime.        . sertraline (ZOLOFT) 100 MG tablet Take 100 mg by mouth every morning.      . triamterene-hydrochlorothiazide (DYAZIDE) 37.5-25 MG per capsule Take 1 capsule by mouth every morning.        . valACYclovir (VALTREX) 500 MG tablet        No  current facility-administered medications for this visit.     Past Medical History  Diagnosis Date  . Hypertension 2008  . Hyperlipidemia   . Depression   . GERD (gastroesophageal reflux disease)   . Atrial fibrillation     Past Surgical History  Procedure Laterality Date  . Appendectomy      History   Social History  . Marital Status: Married    Spouse Name: N/A    Number of Children: N/A  . Years of Education: N/A   Occupational History  .      General Contractor    Social History Main Topics  . Smoking status: Former Smoker -- 3 years    Types: Cigars  . Smokeless tobacco: Never Used     Comment: as a teenager  . Alcohol Use: 21.0 oz/week    7 Glasses of wine, 28 Cans of beer per week     Comment: 2 drinks/nightly  . Drug Use: No  . Sexual Activity: Not on file   Other Topics Concern  . Not on file   Social History Narrative  . No narrative on file    ROS: no fevers or chills, productive cough, hemoptysis, dysphasia, odynophagia, melena, hematochezia, dysuria, hematuria, rash, seizure activity, orthopnea, PND, pedal edema, claudication. Remaining systems are negative.  Physical   Exam: Well-developed well-nourished in no acute distress.  Skin is warm and dry.  HEENT is normal.  Neck is supple.  Chest is clear to auscultation with normal expansion.  Cardiovascular exam is irregular and tachycardic Abdominal exam nontender or distended. No masses palpated. Extremities show no edema. neuro grossly intact  ECG atrial fibrillation at a rate of 120. No ST changes to

## 2013-07-26 NOTE — Assessment & Plan Note (Signed)
Blood pressure controlled.continue present medications. 

## 2013-07-26 NOTE — Patient Instructions (Signed)
Your physician recommends that you schedule a follow-up appointment in: [place]  START METOPROLOL SUCC ER 25 MG ONCE DAILY  START FLECAINIDE 50 MG TWICE DAILY  Your physician has requested that you have an exercise tolerance test. For further information please visit [email or web address]. Please also follow instruction sheet, as given.  SCHEDULE IN ONE WEEK  Your physician recommends that you HAVE LAB WORK TODAY  Your physician has recommended that you have a Cardioversion (DCCV). Electrical Cardioversion uses a jolt of electricity to your heart either through paddles or wired patches attached to your chest. This is a controlled, usually prescheduled, procedure. Defibrillation is done under light anesthesia in the hospital, and you usually go home the day of the procedure. This is done to get your heart back into a normal rhythm. You are not awake for the procedure. Please see the instruction sheet given to you today.

## 2013-07-26 NOTE — Assessment & Plan Note (Signed)
Continue statin. 

## 2013-07-29 ENCOUNTER — Ambulatory Visit (HOSPITAL_COMMUNITY)
Admission: RE | Admit: 2013-07-29 | Discharge: 2013-07-29 | Disposition: A | Discharge status: Home / Self Care | Payer: BC Managed Care – PPO | Source: Ambulatory Visit | Attending: Cardiology | Admitting: Cardiology

## 2013-07-29 ENCOUNTER — Encounter (HOSPITAL_COMMUNITY)
Admission: RE | Disposition: A | Discharge status: Home / Self Care | Payer: Self-pay | Source: Ambulatory Visit | Attending: Cardiology

## 2013-07-29 ENCOUNTER — Encounter (HOSPITAL_COMMUNITY): Payer: BC Managed Care – PPO | Admitting: Anesthesiology

## 2013-07-29 ENCOUNTER — Encounter (HOSPITAL_COMMUNITY): Payer: Self-pay

## 2013-07-29 ENCOUNTER — Ambulatory Visit (HOSPITAL_COMMUNITY): Payer: BC Managed Care – PPO | Admitting: Anesthesiology

## 2013-07-29 DIAGNOSIS — E785 Hyperlipidemia, unspecified: Secondary | ICD-10-CM | POA: Insufficient documentation

## 2013-07-29 DIAGNOSIS — I4891 Unspecified atrial fibrillation: Secondary | ICD-10-CM | POA: Insufficient documentation

## 2013-07-29 DIAGNOSIS — F3289 Other specified depressive episodes: Secondary | ICD-10-CM | POA: Insufficient documentation

## 2013-07-29 DIAGNOSIS — Z7901 Long term (current) use of anticoagulants: Secondary | ICD-10-CM | POA: Insufficient documentation

## 2013-07-29 DIAGNOSIS — I10 Essential (primary) hypertension: Secondary | ICD-10-CM | POA: Insufficient documentation

## 2013-07-29 DIAGNOSIS — G4733 Obstructive sleep apnea (adult) (pediatric): Secondary | ICD-10-CM | POA: Insufficient documentation

## 2013-07-29 DIAGNOSIS — Z87891 Personal history of nicotine dependence: Secondary | ICD-10-CM | POA: Insufficient documentation

## 2013-07-29 DIAGNOSIS — F329 Major depressive disorder, single episode, unspecified: Secondary | ICD-10-CM | POA: Insufficient documentation

## 2013-07-29 DIAGNOSIS — K219 Gastro-esophageal reflux disease without esophagitis: Secondary | ICD-10-CM | POA: Insufficient documentation

## 2013-07-29 DIAGNOSIS — F411 Generalized anxiety disorder: Secondary | ICD-10-CM | POA: Insufficient documentation

## 2013-07-29 HISTORY — PX: CARDIOVERSION: SHX1299

## 2013-07-29 SURGERY — CARDIOVERSION
Anesthesia: General

## 2013-07-29 MED ORDER — FLECAINIDE ACETATE 50 MG PO TABS: ORAL_TABLET | ORAL | Status: DC

## 2013-07-29 MED ORDER — LIDOCAINE HCL (CARDIAC) 20 MG/ML IV SOLN
INTRAVENOUS | Status: DC | PRN
  Administered 2013-07-29: 40 mg via INTRAVENOUS

## 2013-07-29 MED ORDER — PROPOFOL 10 MG/ML IV BOLUS
INTRAVENOUS | Status: DC | PRN
  Administered 2013-07-29: 95 mg via INTRAVENOUS

## 2013-07-29 MED ORDER — SODIUM CHLORIDE 0.9 % IV SOLN
INTRAVENOUS | Status: DC
  Administered 2013-07-29: 500 mL via INTRAVENOUS
  Administered 2013-07-29: 10:00:00 via INTRAVENOUS

## 2013-07-29 MED ORDER — FLECAINIDE ACETATE 50 MG PO TABS: 50.0000 mg | ORAL_TABLET | Freq: Two times a day (BID) | ORAL | Status: DC

## 2013-07-29 NOTE — Preoperative (Signed)
Beta Blockers   Reason not to administer Beta Blockers:Not Applicable 

## 2013-07-29 NOTE — CV Procedure (Addendum)
Electrical Cardioversion Procedure Note Norman Sims [id number] 1960-09-16  Procedure: Electrical Cardioversion Indications:  Atrial Fibrillation  Time Out: Verified patient identification, verified procedure,medications/allergies/relevent history reviewed, required imaging and test results available.  Performed  Procedure Details  The patient was NPO after midnight. Anesthesia was administered at the beside by Dr.Smith with 95mg  of propofol and Lidocaine 40mg  IV.  Cardioversion was done with synchronized biphasic defibrillation with AP pads with 150watts.  The patient converted to normal sinus rhythm. About 30 seconds later he reverted back to atrial fibrillation.  A second 200 watt synchronized biphasic defibrillation was delivered which converted the patient to NSR but again reverted back to atrial fibrillation.   The patient tolerated the procedure well   IMPRESSION:  Successful cardioversion of atrial fibrillation but reversion back to atrial fibrilaltion at 90bpm.  Discussed with Dr. Stanford Breed.  Will increase Flecainide to 100mg  BID and followup with Dr. Stanford Breed in 1 week for repeat Ekg to see if he spontaneously converted to NSR and if not the repeat DCCV.  Patient had recent sleep study showing severe OSA.  Would recommend getting patient on CPAP at home before retrying DCCV.    Norman Sims R 07/29/2013, 10:08 AM

## 2013-07-29 NOTE — Anesthesia Postprocedure Evaluation (Signed)
  Anesthesia Post-op Note  Patient: Norman Sims  Procedure(s) Performed: Procedure(s): CARDIOVERSION (N/A)  Patient Location: Endoscopy Unit  Anesthesia Type:General  Level of Consciousness: awake, alert  and oriented  Airway and Oxygen Therapy: Patient connected to nasal cannula oxygen  Post-op Pain: none  Post-op Assessment: Patient's Cardiovascular Status Stable  Post-op Vital Signs: stable  Complications: No apparent anesthesia complications

## 2013-07-29 NOTE — H&P (View-Only) (Signed)
HPI: FU atrial fibrillation. [facility] with SOB and cough. He was found to have rapid atrial fibrillation and was sent to [facility]. He was admitted and started on IV diltiazem for rate control. Within 24 hours he spontaneously converted to SR. He was started on Eliquis and diltiazem. CXR showed no acute abnormality. D-dimer was negative. Troponin negative. TSH 8.3, free T4 1.1. An echo was done revealing normal LV function and no significant valvular abnormality; mild biatrial enlargement. His cough was felt to be related to ACEI; therefore, this was discontinued. He was instructed to decrease his alcohol intake. Nuclear study in February of 2015 showed normal perfusion. Study not gated. Since he was last seen he has occasional minimal dyspnea on exertion and mild orthopnea. No chest pain. Occasional mild palpitations.   Current Outpatient Prescriptions  Medication Sig Dispense Refill  . apixaban (ELIQUIS) 5 MG TABS tablet Take 1 tablet (5 mg total) by mouth 2 (two) times daily.  60 tablet  11  . atorvastatin (LIPITOR) 20 MG tablet Take 20 mg by mouth every morning.       . cholecalciferol (VITAMIN D) 1000 UNITS tablet Take 1,000 Units by mouth every morning.      Marland Kitchen DILTIAZEM HCL CD 360 MG 24 hr capsule TAKE 1 CAPSULE (360 MG TOTAL) BY MOUTH DAILY.  30 capsule  1  . fish oil-omega-3 fatty acids 1000 MG capsule Take 3,000 mg by mouth every morning.       . Multiple Vitamin (MULTIVITAMIN) capsule Take 1 capsule by mouth every morning.       [name] (ZEGERID) 20-1100 MG CAPS capsule Take 1 capsule by mouth daily.       . QUEtiapine (SEROQUEL) 400 MG tablet Take 400 mg by mouth at bedtime.        . sertraline (ZOLOFT) 100 MG tablet Take 100 mg by mouth every morning.      . triamterene-hydrochlorothiazide (DYAZIDE) 37.5-25 MG per capsule Take 1 capsule by mouth every morning.        . valACYclovir (VALTREX) 500 MG tablet        No  current facility-administered medications for this visit.     Past Medical History  Diagnosis Date  . Hypertension 2008  . Hyperlipidemia   . Depression   . GERD (gastroesophageal reflux disease)   . Atrial fibrillation     Past Surgical History  Procedure Laterality Date  . Appendectomy      History   Social History  . Marital Status: Married    Spouse Name: N/A    Number of Children: N/A  . Years of Education: N/A   Occupational History  .      General Contractor    Social History Main Topics  . Smoking status: Former Smoker -- 3 years    Types: Cigars  . Smokeless tobacco: Never Used     Comment: as a teenager  . Alcohol Use: 21.0 oz/week    7 Glasses of wine, 28 Cans of beer per week     Comment: 2 drinks/nightly  . Drug Use: No  . Sexual Activity: Not on file   Other Topics Concern  . Not on file   Social History Narrative  . No narrative on file    ROS: no fevers or chills, productive cough, hemoptysis, dysphasia, odynophagia, melena, hematochezia, dysuria, hematuria, rash, seizure activity, orthopnea, PND, pedal edema, claudication. Remaining systems are negative.  Physical  Exam: Well-developed well-nourished in no acute distress.  Skin is warm and dry.  HEENT is normal.  Neck is supple.  Chest is clear to auscultation with normal expansion.  Cardiovascular exam is irregular and tachycardic Abdominal exam nontender or distended. No masses palpated. Extremities show no edema. neuro grossly intact  ECG atrial fibrillation at a rate of 120. No ST changes to

## 2013-07-29 NOTE — Discharge Instructions (Signed)
Conscious Sedation, Adult, Care After °Refer to this sheet in the next few weeks. These instructions provide you with information on caring for yourself after your procedure. Your health care provider may also give you more specific instructions. Your treatment has been planned according to current medical practices, but problems sometimes occur. Call your health care provider if you have any problems or questions after your procedure. °WHAT TO EXPECT AFTER THE PROCEDURE  °After your procedure: °· You may feel sleepy, clumsy, and have poor balance for several hours. °· Vomiting may occur if you eat too soon after the procedure. °HOME CARE INSTRUCTIONS °· Do not participate in any activities where you could become injured for at least 24 hours. Do not: °· Drive. °· Swim. °· Ride a bicycle. °· Operate heavy machinery. °· Cook. °· Use power tools. °· Climb ladders. °· Work from a high place. °· Do not make important decisions or sign legal documents until you are improved. °· If you vomit, drink water, juice, or soup when you can drink without vomiting. Make sure you have little or no nausea before eating solid foods. °· Only take over-the-counter or prescription medicines for pain, discomfort, or fever as directed by your health care provider. °· Make sure you and your family fully understand everything about the medicines given to you, including what side effects may occur. °· You should not drink alcohol, take sleeping pills, or take medicines that cause drowsiness for at least 24 hours. °· If you smoke, do not smoke without supervision. °· If you are feeling better, you may resume normal activities 24 hours after you were sedated. °· Keep all appointments with your health care provider. °SEEK MEDICAL CARE IF: °· Your skin is pale or bluish in color. °· You continue to feel nauseous or vomit. °· Your pain is getting worse and is not helped by medicine. °· You have bleeding or swelling. °· You are still sleepy or  feeling clumsy after 24 hours. °SEEK IMMEDIATE MEDICAL CARE IF: °· You develop a rash. °· You have difficulty breathing. °· You develop any type of allergic problem. °· You have a fever. °MAKE SURE YOU: °· Understand these instructions. °· Will watch your condition. °· Will get help right away if you are not doing well or get worse. °Document Released: 02/17/2013 Document Reviewed: 12/04/2012 °[employer]® Patient Information ©2014 [employer]. ° °

## 2013-07-29 NOTE — Anesthesia Preprocedure Evaluation (Addendum)
Anesthesia Evaluation  Patient identified by MRN, date of birth, ID band Patient awake    Reviewed: Allergy & Precautions, H&P , NPO status , Patient's Chart, lab work & pertinent test results, reviewed documented beta blocker date and time   Airway Mallampati: II TM Distance: >3 FB     Dental  (+) Teeth Intact, Dental Advisory Given   Pulmonary neg pulmonary ROS, sleep apnea and Continuous Positive Airway Pressure Ventilation , former smoker,  breath sounds clear to auscultation        Cardiovascular hypertension, Pt. on medications + dysrhythmias Atrial Fibrillation Rhythm:Irregular     Neuro/Psych Anxiety negative neurological ROS     GI/Hepatic Neg liver ROS, GERD-  Medicated and Controlled,  Endo/Other  negative endocrine ROS  Renal/GU negative Renal ROS     Musculoskeletal negative musculoskeletal ROS (+)   Abdominal (+)  Abdomen: soft. Bowel sounds: normal.  Peds  Hematology negative hematology ROS (+)   Anesthesia Other Findings   Reproductive/Obstetrics negative OB ROS                        Anesthesia Physical Anesthesia Plan  ASA: III  Anesthesia Plan: General   Post-op Pain Management:    Induction: Intravenous  Airway Management Planned: Mask  Additional Equipment:   Intra-op Plan:   Post-operative Plan:   Informed Consent: I have reviewed the patients History and Physical, chart, labs and discussed the procedure including the risks, benefits and alternatives for the proposed anesthesia with the patient or authorized representative who has indicated his/her understanding and acceptance.     Plan Discussed with:   Anesthesia Plan Comments:         Anesthesia Quick Evaluation

## 2013-07-29 NOTE — Interval H&P Note (Signed)
History and Physical Interval Note:  07/29/2013 10:07 AM  Norman Sims  has presented today for surgery, with the diagnosis of A FIB   The various methods of treatment have been discussed with the patient and family. After consideration of risks, benefits and other options for treatment, the patient has consented to  Procedure(s): CARDIOVERSION (N/A) as a surgical intervention .  The patient's history has been reviewed, patient examined, no change in status, stable for surgery.  I have reviewed the patient's chart and labs.  Questions were answered to the patient's satisfaction.     Katoria Yetman R

## 2013-07-29 NOTE — Transfer of Care (Signed)
Immediate Anesthesia Transfer of Care Note  Patient: Norman Sims  Procedure(s) Performed: Procedure(s): CARDIOVERSION (N/A)  Patient Location: Endoscopy Unit  Anesthesia Type:General  Level of Consciousness: awake, alert  and oriented  Airway & Oxygen Therapy: Patient connected to nasal cannula oxygen  Post-op Assessment: Post -op Vital signs reviewed and stable  Post vital signs: stable  Complications: No apparent anesthesia complications

## 2013-07-30 ENCOUNTER — Telehealth (HOSPITAL_COMMUNITY): Payer: Self-pay

## 2013-07-30 ENCOUNTER — Telehealth: Payer: Self-pay | Admitting: Cardiology

## 2013-07-30 ENCOUNTER — Encounter (HOSPITAL_COMMUNITY): Payer: Self-pay | Admitting: Cardiology

## 2013-07-30 NOTE — Telephone Encounter (Signed)
New message    Patient has upcoming treadmill @  [facility] on 3/24 . Given that his heart was shot twice should he keep this appt.

## 2013-07-30 NOTE — Telephone Encounter (Signed)
Spoke with pt, explained the treadmill testing is related to the flecainide. He voiced understanding.

## 2013-08-03 ENCOUNTER — Ambulatory Visit (HOSPITAL_COMMUNITY)
Admission: RE | Admit: 2013-08-03 | Discharge: 2013-08-03 | Disposition: A | Discharge status: Home / Self Care | Payer: BC Managed Care – PPO | Source: Ambulatory Visit | Attending: Cardiology | Admitting: Cardiology

## 2013-08-03 ENCOUNTER — Other Ambulatory Visit (INDEPENDENT_AMBULATORY_CARE_PROVIDER_SITE_OTHER): Payer: BC Managed Care – PPO

## 2013-08-03 DIAGNOSIS — E876 Hypokalemia: Secondary | ICD-10-CM

## 2013-08-03 DIAGNOSIS — I10 Essential (primary) hypertension: Secondary | ICD-10-CM

## 2013-08-03 DIAGNOSIS — I4891 Unspecified atrial fibrillation: Secondary | ICD-10-CM

## 2013-08-03 LAB — BASIC METABOLIC PANEL
BUN: 22 mg/dL (ref 6–23)
CO2: 29 meq/L (ref 19–32)
Calcium: 9.2 mg/dL (ref 8.4–10.5)
Chloride: 100 mEq/L (ref 96–112)
Creatinine, Ser: 0.9 mg/dL (ref 0.4–1.5)
GFR: 90.88 mL/min (ref >60.00)
GLUCOSE: 99 mg/dL (ref 70–99)
POTASSIUM: 3.4 meq/L — AB (ref 3.5–5.1)
SODIUM: 137 meq/L (ref 135–145)

## 2013-08-04 ENCOUNTER — Other Ambulatory Visit: Payer: Self-pay | Admitting: *Deleted

## 2013-08-04 DIAGNOSIS — E876 Hypokalemia: Secondary | ICD-10-CM

## 2013-08-04 MED ORDER — POTASSIUM CHLORIDE CRYS ER 20 MEQ PO TBCR: 20.0000 meq | EXTENDED_RELEASE_TABLET | Freq: Two times a day (BID) | ORAL | Status: DC

## 2013-08-06 ENCOUNTER — Ambulatory Visit: Payer: BC Managed Care – PPO | Admitting: Physician Assistant

## 2013-08-10 ENCOUNTER — Ambulatory Visit (INDEPENDENT_AMBULATORY_CARE_PROVIDER_SITE_OTHER): Payer: BC Managed Care – PPO | Admitting: Cardiology

## 2013-08-10 ENCOUNTER — Encounter: Payer: Self-pay | Admitting: Cardiology

## 2013-08-10 VITALS — BP 168/100 | HR 82 | Ht 71.0 in | Wt 275.0 lb

## 2013-08-10 DIAGNOSIS — G4730 Sleep apnea, unspecified: Secondary | ICD-10-CM

## 2013-08-10 DIAGNOSIS — E785 Hyperlipidemia, unspecified: Secondary | ICD-10-CM

## 2013-08-10 DIAGNOSIS — I10 Essential (primary) hypertension: Secondary | ICD-10-CM

## 2013-08-10 DIAGNOSIS — I4891 Unspecified atrial fibrillation: Secondary | ICD-10-CM

## 2013-08-10 MED ORDER — METOPROLOL SUCCINATE ER 50 MG PO TB24: 50.0000 mg | ORAL_TABLET | Freq: Every day | ORAL | Status: DC

## 2013-08-10 NOTE — Assessment & Plan Note (Signed)
Continue statin. 

## 2013-08-10 NOTE — Assessment & Plan Note (Signed)
Blood pressure elevated.increase Toprol to 50 mg daily. Hopefully treating sleep apnea will also improve his blood pressure.

## 2013-08-10 NOTE — Progress Notes (Signed)
HPI: FU atrial fibrillation. [facility] with SOB and cough. He was found to have rapid atrial fibrillation and was sent to [facility]. He was admitted and started on IV diltiazem for rate control. Within 24 hours he spontaneously converted to SR. He was started on Eliquis and diltiazem. An echo was done revealing normal LV function and no significant valvular abnormality; mild biatrial enlargement. His cough was felt to be related to ACEI; therefore, this was discontinued. He was instructed to decrease his alcohol intake. Nuclear study in February of 2015 showed normal perfusion. Study not gated. I saw him earlier this month and afib had recurred; flecanide and toprol added. Patient had DCCV 3/19 but did not hold sinus. Flecanide increased to 100 BID. Severe OSA noted and CPAP recommended prior to repeating DCCV. Exercise treadmill on 08/03/2013 showed no exercise induced ventricular arrhythmias. Since he was last seen, he does not have dyspnea, chest pain, palpitations or syncope. He has had problems with fatigue and sleepiness but this has improved after discontinuing Seroquel   Current Outpatient Prescriptions  Medication Sig Dispense Refill  . apixaban (ELIQUIS) 5 MG TABS tablet Take 1 tablet (5 mg total) by mouth 2 (two) times daily.  60 tablet  11  . atorvastatin (LIPITOR) 20 MG tablet Take 20 mg by mouth every morning.       . cholecalciferol (VITAMIN D) 1000 UNITS tablet Take 1,000 Units by mouth every morning.      Marland Kitchen DILTIAZEM HCL CD 360 MG 24 hr capsule TAKE 1 CAPSULE (360 MG TOTAL) BY MOUTH DAILY.  30 capsule  1  . fish oil-omega-3 fatty acids 1000 MG capsule Take 3,000 mg by mouth every morning.       . flecainide (TAMBOCOR) 50 MG tablet Take 2 tablets twice daily  180 tablet  3  . metoprolol succinate (TOPROL XL) 50 MG 24 hr tablet Take 1 tablet (50 mg total) by mouth daily.  90 tablet  3  . Multiple Vitamin (MULTIVITAMIN) capsule Take 1 capsule by  mouth every morning.       [name] (ZEGERID) 20-1100 MG CAPS capsule Take 1 capsule by mouth daily.       . potassium chloride SA (K-DUR,KLOR-CON) 20 MEQ tablet Take 1 tablet (20 mEq total) by mouth 2 (two) times daily.  90 tablet  3  . sertraline (ZOLOFT) 100 MG tablet Take 100 mg by mouth daily.      Marland Kitchen triamterene-hydrochlorothiazide (DYAZIDE) 37.5-25 MG per capsule Take 1 capsule by mouth every morning.        . valACYclovir (VALTREX) 500 MG tablet        No current facility-administered medications for this visit.     Past Medical History  Diagnosis Date  . Hypertension 2008  . Hyperlipidemia   . Depression   . GERD (gastroesophageal reflux disease)   . Atrial fibrillation     Past Surgical History  Procedure Laterality Date  . Appendectomy    . Cardioversion N/A 07/29/2013    Procedure: CARDIOVERSION;  Surgeon: Sueanne Margarita, MD;  Location: [facility] ENDOSCOPY;  Service: Cardiovascular;  Laterality: N/A;    History   Social History  . Marital Status: Married    Spouse Name: N/A    Number of Children: N/A  . Years of Education: N/A   Occupational History  .      General Contractor    Social History Main Topics  . Smoking  status: Former Smoker -- 3 years    Types: Cigars  . Smokeless tobacco: Never Used     Comment: as a teenager  . Alcohol Use: 21.0 oz/week    7 Glasses of wine, 28 Cans of beer per week     Comment: 2 drinks/nightly  . Drug Use: No  . Sexual Activity: Not on file   Other Topics Concern  . Not on file   Social History Narrative  . No narrative on file    ROS: no fevers or chills, productive cough, hemoptysis, dysphasia, odynophagia, melena, hematochezia, dysuria, hematuria, rash, seizure activity, orthopnea, PND, pedal edema, claudication. Remaining systems are negative.  Physical Exam: Well-developed well-nourished in no acute distress.  Skin is warm and dry.  HEENT is normal.  Neck is supple.  Chest is clear to  auscultation with normal expansion.  Cardiovascular exam is irregular Abdominal exam nontender or distended. No masses palpated. Extremities show no edema. neuro grossly intact  ECG atrial fibrillation at a rate of 82. No ST changes.

## 2013-08-10 NOTE — Assessment & Plan Note (Addendum)
Patient is status post cardioversion. He did not hold sinus rhythm. His flecainide was increased to 100 mg twice a day. Followup exercise treadmill did not show exercise induced ventricular arrhythmias. He has significant sleep apnea. This will need to be treated prior to repeating cardioversion. I will refer to pulmonary for further management. Continue Cardizem. Increase Toprol to 50 mg daily. Continue apixaban. Begin discussed options of rate control versus rhythm control. Given his young age I would like to maintain sinus rhythm if possible. His atrial fibrillation could also be contributing to his fatigue. We will plan to repeat cardioversion once sleep apnea is treated to see if he will maintain sinus rhythm. If not we could refer to Dr. Rayann Heman for further evaluation afterwards. We again discussed decreasing alcohol use.

## 2013-08-10 NOTE — Patient Instructions (Signed)
Your physician recommends that you schedule a follow-up appointment in: [place] METOPROLOL TO 50 MG ONCE DAILY

## 2013-08-12 ENCOUNTER — Telehealth: Payer: Self-pay | Admitting: Cardiology

## 2013-08-12 DIAGNOSIS — I10 Essential (primary) hypertension: Secondary | ICD-10-CM

## 2013-08-12 MED ORDER — LOSARTAN POTASSIUM 50 MG PO TABS: 50.0000 mg | ORAL_TABLET | Freq: Every day | ORAL | Status: DC

## 2013-08-12 NOTE — Telephone Encounter (Signed)
Spoke with pt, his meds were increased at the last office visit. He reports elevated bp today. He is uncertain of his meds and will call me back once he gets to the list.

## 2013-08-12 NOTE — Telephone Encounter (Signed)
Spoke with pt, Aware of dr crenshaw's recommendations.  °

## 2013-08-12 NOTE — Telephone Encounter (Signed)
New message    Pt is on 1 bp medication because he is on a blood thinner.  Since he stopped the other 2 bp medications his bp has been running high--168/110.  He has had a headache for 3 days.  Please advise.

## 2013-08-12 NOTE — Telephone Encounter (Signed)
Spoke with pt, he is taking the metoprolol succ 25 mg twice daily. Explained to pt he should take two 25 mg tablets at the sametime once daily. He reports his bp has been running high since he was seen 3/31. He wants to make dr Stanford Breed aware. Will forward for dr Stanford Breed review

## 2013-08-12 NOTE — Telephone Encounter (Signed)
Cozaar 50 mg daily; bmet one week Mammie Meras  

## 2013-08-27 ENCOUNTER — Other Ambulatory Visit: Payer: Self-pay | Admitting: *Deleted

## 2013-08-27 MED ORDER — DILTIAZEM HCL ER COATED BEADS 360 MG PO CP24: ORAL_CAPSULE | ORAL | Status: DC

## 2013-09-06 ENCOUNTER — Ambulatory Visit (INDEPENDENT_AMBULATORY_CARE_PROVIDER_SITE_OTHER): Payer: BC Managed Care – PPO | Admitting: Pulmonary Disease

## 2013-09-06 ENCOUNTER — Encounter: Payer: Self-pay | Admitting: Pulmonary Disease

## 2013-09-06 VITALS — BP 134/76 | HR 63 | Temp 98.0°F | Ht 71.0 in | Wt 277.0 lb

## 2013-09-06 DIAGNOSIS — G4733 Obstructive sleep apnea (adult) (pediatric): Secondary | ICD-10-CM | POA: Insufficient documentation

## 2013-09-06 NOTE — Patient Instructions (Signed)
Will start on CPAP at a moderate pressure. Please call if having tolerance issues Work aggressively on weight loss. Followup with me again in 8 weeks.

## 2013-09-06 NOTE — Assessment & Plan Note (Signed)
The patient has been diagnosed with severe obstructive sleep apnea, and had a good response to CPAP during the split night study. It had a long discussion with him about the pathophysiology of sleep disordered breathing, including its impact to his quality of life and cardiovascular health. He will need to be started on CPAP given the severity of his sleep apnea, and I've also encouraged him to work aggressively on weight loss. I will set the patient up on cpap at a moderate pressure level to allow for desensitization, and will troubleshoot the device over the next 4-6weeks if needed.  The pt is to call me if having issues with tolerance.  Will then optimize the pressure once patient is able to wear cpap on a consistent basis.

## 2013-09-06 NOTE — Progress Notes (Signed)
Subjective:    Patient ID: Norman Sims, male    DOB: 21-Dec-1960, 53 y.o.   MRN: [id number]  HPI The patient is a 53 year old male who I've been asked to see for management of obstructive sleep apnea. He has had a recent sleep study that showed severe OSA, with an AHI of 48 of events per hour during a split night study. He was started on CPAP, and titrated to a level of 15 cm, with only breakthrough pressure induced central events. The patient has a history of loud snoring, as well as witnessed apneas during sleep. He has frequent awakenings at night, and is not rested in the mornings upon arising. He is not overly sleepy during the day or with driving, but notes significant sleepiness and dozing in the evening if he tries to watch television or movies. His weight is up 35 pounds over the last 2 years, and his Epworth score today is 6.   Sleep Questionnaire What time do you typically go to bed?( Between what hours) 9:00-11:00 9:00-11:00 at 0929 on 09/06/13 by Len Blalock, CMA How long does it take you to fall asleep? 30 minutes 30 minutes at 0929 on 09/06/13 by Len Blalock, CMA How many times during the night do you wake up? 44 Started waking up to use the restroom since being put on new heart med X3wks ago at 0929 on 09/06/13 by Len Blalock, CMA What time do you get out of bed to start your day? 0530 0530 at 0929 on 09/06/13 by Len Blalock, CMA Do you drive or operate heavy machinery in your occupation? No No at 0929 on 09/06/13 by Len Blalock, CMA How much has your weight changed (up or down) over the past two years? (In pounds) 35 lb (15.876 kg)35 lb (15.876 kg) increase at 0929 on 09/06/13 by Len Blalock, CMA Have you ever had a sleep study before? Yes Yes at 0929 on 09/06/13 by Len Blalock, CMA If yes, location of study? [place] at 1884 on 09/06/13 by Len Blalock, CMA If yes, date of study?  07/2013 07/2013 at 0929 on 09/06/13 by Len Blalock, CMA Do you currently use CPAP? No No at 0929 on 09/06/13 by Len Blalock, CMA Do you wear oxygen at any time? No No at 0929 on 09/06/13 by Len Blalock, CMA   Review of Systems  Constitutional: Negative for fever and unexpected weight change.  HENT: Positive for sore throat. Negative for congestion, dental problem, ear pain, nosebleeds, postnasal drip, rhinorrhea, sinus pressure, sneezing and trouble swallowing.   Eyes: Negative for redness and itching.  Respiratory: Positive for cough and shortness of breath. Negative for chest tightness and wheezing.   Cardiovascular: Negative for palpitations and leg swelling.  Gastrointestinal: Negative for nausea and vomiting.  Genitourinary: Negative for dysuria.  Musculoskeletal: Negative for joint swelling.  Skin: Negative for rash.  Neurological: Negative for headaches.  Hematological: Does not bruise/bleed easily.  Psychiatric/Behavioral: Negative for dysphoric mood. The patient is not nervous/anxious.        Objective:   Physical Exam Constitutional:  Obese male, no acute distress  HENT:  Nares patent without discharge, mild septal deviation to the left with narrowing.  Oropharynx without exudate, palate and uvula are thick and elongated, +small posterior pharyngeal space.  Eyes:  Perrla, eomi, no scleral icterus  Neck:  No JVD, no TMG  Cardiovascular:  Normal rate, regular rhythm, no rubs or  gallops.  No murmurs        Intact distal pulses  Pulmonary :  Normal breath sounds, no stridor or respiratory distress   No rales, rhonchi, or wheezing  Abdominal:  Soft, nondistended, bowel sounds present.  No tenderness noted.   Musculoskeletal:  minimal lower extremity edema noted.  Lymph Nodes:  No cervical lymphadenopathy noted  Skin:  No cyanosis noted  Neurologic:  Alert, appropriate, moves all 4 extremities without obvious deficit.          Assessment & Plan:

## 2013-10-01 ENCOUNTER — Other Ambulatory Visit: Payer: Self-pay | Admitting: Cardiology

## 2013-10-01 ENCOUNTER — Encounter: Payer: Self-pay | Admitting: Cardiology

## 2013-10-01 ENCOUNTER — Encounter: Payer: Self-pay | Admitting: *Deleted

## 2013-10-01 ENCOUNTER — Ambulatory Visit (INDEPENDENT_AMBULATORY_CARE_PROVIDER_SITE_OTHER): Payer: BC Managed Care – PPO | Admitting: Cardiology

## 2013-10-01 VITALS — BP 150/110 | HR 82 | Ht 71.5 in | Wt 279.0 lb

## 2013-10-01 DIAGNOSIS — I10 Essential (primary) hypertension: Secondary | ICD-10-CM

## 2013-10-01 DIAGNOSIS — I4891 Unspecified atrial fibrillation: Secondary | ICD-10-CM

## 2013-10-01 DIAGNOSIS — E785 Hyperlipidemia, unspecified: Secondary | ICD-10-CM

## 2013-10-01 DIAGNOSIS — G4733 Obstructive sleep apnea (adult) (pediatric): Secondary | ICD-10-CM

## 2013-10-01 LAB — BASIC METABOLIC PANEL
BUN: 14 mg/dL (ref 6–23)
CHLORIDE: 99 meq/L (ref 96–112)
CO2: 30 meq/L (ref 19–32)
CREATININE: 1 mg/dL (ref 0.4–1.5)
Calcium: 9.5 mg/dL (ref 8.4–10.5)
GFR: 86.47 mL/min (ref >60.00)
Glucose, Bld: 102 mg/dL — ABNORMAL HIGH (ref 70–99)
Potassium: 4.5 mEq/L (ref 3.5–5.1)
Sodium: 138 mEq/L (ref 135–145)

## 2013-10-01 NOTE — Assessment & Plan Note (Signed)
Patient is status post cardioversion. He did not hold sinus rhythm. His flecainide was increased to 100 mg twice a day. Followup exercise treadmill did not show exercise induced ventricular arrhythmias. He has significant sleep apnea. This has now been treated. We will plan to proceed with cardioversion in 4 weeks. If he does not hold sinus rhythm we will need to decide between rate control versus rhythm control. Given his young age I would like to maintain sinus rhythm if possible. His atrial fibrillation could also be contributing to his fatigue but this has improved with Cpap. Continue Cardizem and Toprol 50 mg daily. Continue apixaban.

## 2013-10-01 NOTE — Assessment & Plan Note (Addendum)
Blood pressure is elevated today but he follows this at home and it is typically controlled. Continue to monitor. Increase Cozaar if needed. Check potassium and renal function.

## 2013-10-01 NOTE — Patient Instructions (Signed)
Your physician recommends that you schedule a follow-up appointment in: [place]  Your physician recommends that you HAVE [facility]  Your physician has recommended that you have a Cardioversion (DCCV). Electrical Cardioversion uses a jolt of electricity to your heart either through paddles or wired patches attached to your chest. This is a controlled, usually prescheduled, procedure. Defibrillation is done under light anesthesia in the hospital, and you usually go home the day of the procedure. This is done to get your heart back into a normal rhythm. You are not awake for the procedure. Please see the instruction sheet given to you today.

## 2013-10-01 NOTE — Progress Notes (Signed)
HPI: FU atrial fibrillation. [facility]2013 with SOB and cough. He was found to have rapid atrial fibrillation and was sent to [facility]. He was admitted and started on IV diltiazem for rate control. Within 24 hours he spontaneously converted to SR. He was started on Eliquis and diltiazem. An echo was done revealing normal LV function and no significant valvular abnormality; mild biatrial enlargement. His cough was felt to be related to ACEI; therefore, this was discontinued. He was instructed to decrease his alcohol intake. Nuclear study in February of 2015 showed normal perfusion. Afib recurred; flecanide and toprol added. Patient had DCCV 3/19 but did not hold sinus. Flecanide increased to 100 BID. Severe OSA noted and CPAP recommended prior to repeating DCCV. Exercise treadmill on 08/03/2013 showed no exercise induced ventricular arrhythmias. Since he was last seen, He denies dyspnea, chest pain or palpitations. Mild pedal edema. He is now on CPAP and is sleeping much better. His energy has improved.   Current Outpatient Prescriptions  Medication Sig Dispense Refill  . apixaban (ELIQUIS) 5 MG TABS tablet Take 1 tablet (5 mg total) by mouth 2 (two) times daily.  60 tablet  11  . atorvastatin (LIPITOR) 20 MG tablet Take 20 mg by mouth every morning.       . cholecalciferol (VITAMIN D) 1000 UNITS tablet Take 1,000 Units by mouth every morning.      . diltiazem (DILTIAZEM HCL CD) 360 MG 24 hr capsule TAKE 1 CAPSULE (360 MG TOTAL) BY MOUTH DAILY.  30 capsule  1  . fish oil-omega-3 fatty acids 1000 MG capsule Take 3,000 mg by mouth every morning.       . flecainide (TAMBOCOR) 50 MG tablet Take 2 tablets twice daily  180 tablet  3  . losartan (COZAAR) 50 MG tablet Take 1 tablet (50 mg total) by mouth daily.  90 tablet  3  . metoprolol succinate (TOPROL XL) 50 MG 24 hr tablet Take 1 tablet (50 mg total) by mouth daily.  90 tablet  3  . Omeprazole-Sodium Bicarbonate  (ZEGERID) 20-1100 MG CAPS capsule Take 1 capsule by mouth daily.       . potassium chloride SA (K-DUR,KLOR-CON) 20 MEQ tablet Take 1 tablet (20 mEq total) by mouth 2 (two) times daily.  90 tablet  3  . triamterene-hydrochlorothiazide (DYAZIDE) 37.5-25 MG per capsule Take 1 capsule by mouth every morning.        . valACYclovir (VALTREX) 500 MG tablet Take 500 mg by mouth daily.        No current facility-administered medications for this visit.     Past Medical History  Diagnosis Date  . Hypertension 2008  . Hyperlipidemia   . Depression   . GERD (gastroesophageal reflux disease)   . Atrial fibrillation     Past Surgical History  Procedure Laterality Date  . Appendectomy    . Cardioversion N/A 07/29/2013    Procedure: CARDIOVERSION;  Surgeon: Sueanne Margarita, MD;  Location: [facility];  Service: Cardiovascular;  Laterality: N/A;    History   Social History  . Marital Status: Married    Spouse Name: N/A    Number of Children: N/A  . Years of Education: N/A   Occupational History  .      General Contractor    Social History Main Topics  . Smoking status: Former Smoker -- 3 years    Types: Cigars    Quit date: 09/07/2011  .  Smokeless tobacco: Never Used  . Alcohol Use: 21.0 oz/week    7 Glasses of wine, 28 Cans of beer per week     Comment: 2 drinks/nightly  . Drug Use: No  . Sexual Activity: Not on file   Other Topics Concern  . Not on file   Social History Narrative  . No narrative on file    ROS: no fevers or chills, productive cough, hemoptysis, dysphasia, odynophagia, melena, hematochezia, dysuria, hematuria, rash, seizure activity, orthopnea, PND, pedal edema, claudication. Remaining systems are negative.  Physical Exam: Well-developed well-nourished in no acute distress.  Skin is warm and dry.  HEENT is normal.  Neck is supple.  Chest is clear to auscultation with normal expansion.  Cardiovascular exam is irregular Abdominal exam nontender or  distended. No masses palpated. Extremities show Trace to 1+ edema. neuro grossly intact  ECG Atrial fibrillation at a rate of 82. No significant ST changes.

## 2013-10-01 NOTE — Assessment & Plan Note (Signed)
Patient now on CPAP and his energy is much better.

## 2013-10-01 NOTE — Assessment & Plan Note (Signed)
Management per primary care. 

## 2013-10-07 ENCOUNTER — Other Ambulatory Visit: Payer: Self-pay | Admitting: *Deleted

## 2013-10-07 ENCOUNTER — Telehealth: Payer: Self-pay | Admitting: Cardiology

## 2013-10-07 DIAGNOSIS — R609 Edema, unspecified: Secondary | ICD-10-CM

## 2013-10-07 DIAGNOSIS — E876 Hypokalemia: Secondary | ICD-10-CM

## 2013-10-07 MED ORDER — FUROSEMIDE 40 MG PO TABS: 40.0000 mg | ORAL_TABLET | Freq: Every day | ORAL | Status: DC

## 2013-10-07 MED ORDER — POTASSIUM CHLORIDE CRYS ER 20 MEQ PO TBCR: 20.0000 meq | EXTENDED_RELEASE_TABLET | Freq: Two times a day (BID) | ORAL | Status: DC

## 2013-10-07 NOTE — Telephone Encounter (Signed)
Lasix 40 mg daily PRN worsening pedal edema; bmet one week Norman Sims

## 2013-10-07 NOTE — Telephone Encounter (Signed)
New Message:  Pt is c/o swelling in his ankles and legs. Pt is requesting to speak with the nurse.. States his right leg is worse than his left.

## 2013-10-07 NOTE — Telephone Encounter (Signed)
Spoke with pt, he states the pedal edema he had at the last office visit has gotten worse. There is nothing in the morning when he gets up but after being up for 2-3 hours on his feet they are swollen. He denies SOB. Will forward for dr Stanford Breed review

## 2013-10-07 NOTE — Telephone Encounter (Signed)
Spoke with pt, Aware of dr crenshaw's recommendations.  °

## 2013-10-07 NOTE — Telephone Encounter (Signed)
Left message for pt to call.

## 2013-10-27 ENCOUNTER — Encounter: Payer: Self-pay | Admitting: *Deleted

## 2013-10-28 ENCOUNTER — Encounter (HOSPITAL_COMMUNITY): Payer: Self-pay | Admitting: Pharmacy Technician

## 2013-10-29 ENCOUNTER — Other Ambulatory Visit: Payer: Self-pay

## 2013-10-29 MED ORDER — DILTIAZEM HCL ER COATED BEADS 360 MG PO CP24: 360.0000 mg | ORAL_CAPSULE | Freq: Every day | ORAL | Status: DC

## 2013-11-01 ENCOUNTER — Ambulatory Visit (HOSPITAL_COMMUNITY): Payer: BC Managed Care – PPO | Admitting: Certified Registered Nurse Anesthetist

## 2013-11-01 ENCOUNTER — Encounter (HOSPITAL_COMMUNITY): Payer: Self-pay | Admitting: Certified Registered Nurse Anesthetist

## 2013-11-01 ENCOUNTER — Ambulatory Visit (HOSPITAL_COMMUNITY)
Admission: RE | Admit: 2013-11-01 | Discharge: 2013-11-01 | Disposition: A | Discharge status: Home / Self Care | Payer: BC Managed Care – PPO | Source: Ambulatory Visit | Attending: Cardiology | Admitting: Cardiology

## 2013-11-01 ENCOUNTER — Ambulatory Visit: Payer: BC Managed Care – PPO | Admitting: Pulmonary Disease

## 2013-11-01 ENCOUNTER — Encounter (HOSPITAL_COMMUNITY)
Admission: RE | Disposition: A | Discharge status: Home / Self Care | Payer: Self-pay | Source: Ambulatory Visit | Attending: Cardiology

## 2013-11-01 DIAGNOSIS — I4891 Unspecified atrial fibrillation: Secondary | ICD-10-CM | POA: Insufficient documentation

## 2013-11-01 DIAGNOSIS — Z7901 Long term (current) use of anticoagulants: Secondary | ICD-10-CM | POA: Insufficient documentation

## 2013-11-01 DIAGNOSIS — F3289 Other specified depressive episodes: Secondary | ICD-10-CM | POA: Insufficient documentation

## 2013-11-01 DIAGNOSIS — E785 Hyperlipidemia, unspecified: Secondary | ICD-10-CM | POA: Insufficient documentation

## 2013-11-01 DIAGNOSIS — Z87891 Personal history of nicotine dependence: Secondary | ICD-10-CM | POA: Insufficient documentation

## 2013-11-01 DIAGNOSIS — F329 Major depressive disorder, single episode, unspecified: Secondary | ICD-10-CM | POA: Insufficient documentation

## 2013-11-01 DIAGNOSIS — I10 Essential (primary) hypertension: Secondary | ICD-10-CM | POA: Insufficient documentation

## 2013-11-01 DIAGNOSIS — E876 Hypokalemia: Secondary | ICD-10-CM

## 2013-11-01 DIAGNOSIS — R609 Edema, unspecified: Secondary | ICD-10-CM | POA: Insufficient documentation

## 2013-11-01 DIAGNOSIS — K219 Gastro-esophageal reflux disease without esophagitis: Secondary | ICD-10-CM | POA: Insufficient documentation

## 2013-11-01 HISTORY — PX: CARDIOVERSION: SHX1299

## 2013-11-01 LAB — POCT I-STAT 4, (NA,K, GLUC, HGB,HCT)
GLUCOSE: 101 mg/dL — AB (ref 70–99)
HCT: 44 % (ref 39.0–52.0)
Hemoglobin: 15 g/dL (ref 13.0–17.0)
POTASSIUM: 3.5 meq/L — AB (ref 3.7–5.3)
Sodium: 140 mEq/L (ref 137–147)

## 2013-11-01 SURGERY — CARDIOVERSION
Anesthesia: Monitor Anesthesia Care

## 2013-11-01 MED ORDER — METOPROLOL SUCCINATE ER 50 MG PO TB24
50.0000 mg | ORAL_TABLET | Freq: Once | ORAL | Status: AC
  Administered 2013-11-01: 50 mg via ORAL
  Filled 2013-11-01: qty 1

## 2013-11-01 MED ORDER — LIDOCAINE HCL (CARDIAC) 20 MG/ML IV SOLN
INTRAVENOUS | Status: DC | PRN
  Administered 2013-11-01: 60 mg via INTRAVENOUS

## 2013-11-01 MED ORDER — PROPOFOL 10 MG/ML IV BOLUS
INTRAVENOUS | Status: DC | PRN
  Administered 2013-11-01: 130 mg via INTRAVENOUS

## 2013-11-01 MED ORDER — SODIUM CHLORIDE 0.9 % IV SOLN
INTRAVENOUS | Status: DC | PRN
  Administered 2013-11-01: 500 mL

## 2013-11-01 MED ORDER — POTASSIUM CHLORIDE CRYS ER 20 MEQ PO TBCR: 20.0000 meq | EXTENDED_RELEASE_TABLET | Freq: Every day | ORAL | Status: DC

## 2013-11-01 NOTE — CV Procedure (Addendum)
Electrical Cardioversion Procedure Note Norman Sims [id number] 08-15-1960  Procedure: Electrical Cardioversion Indications:  Atrial Fibrillation  Time Out: Verified patient identification, verified procedure,medications/allergies/relevent history reviewed, required imaging and test results available.  Performed  Procedure Details  The patient was NPO after midnight. Anesthesia was administered at the beside  by Dr.Hodirne with 130mg  of propofol.  Cardioversion was done with synchronized biphasic defibrillation with AP pads with 150watts.  The patient converted to normal sinus rhythm. He then reverted back to atrial fibrillation after a run of PAC's.  Cardioversion was performed again with a synchronized biphasic shock with 150 watts.  The patient tolerated the procedure well   IMPRESSION:  Successful cardioversion of atrial fibrillation but then patient reverted back to atrial fibrillation and a second cardioversion was performed which converted the patient to NSR.    [name],[name] 11/01/2013, 8:53 AM

## 2013-11-01 NOTE — Anesthesia Procedure Notes (Signed)
Procedure Name: MAC Date/Time: 11/01/2013 9:03 AM Performed by: Ned Grace Pre-anesthesia Checklist: Patient identified Patient Re-evaluated:Patient Re-evaluated prior to inductionOxygen Delivery Method: Ambu bag Preoxygenation: Pre-oxygenation with 100% oxygen Intubation Type: IV induction Ventilation: Two handed mask ventilation required

## 2013-11-01 NOTE — H&P (Signed)
HPI: FU atrial fibrillation. [facility] with SOB and cough. He was found to have rapid atrial fibrillation and was sent to [facility]. He was admitted and started on IV diltiazem for rate control. Within 24 hours he spontaneously converted to SR. He was started on Eliquis and diltiazem. An echo was done revealing normal LV function and no significant valvular abnormality; mild biatrial enlargement. His cough was felt to be related to ACEI; therefore, this was discontinued. He was instructed to decrease his alcohol intake. Nuclear study in February of 2015 showed normal perfusion. Afib recurred; flecanide and toprol added. Patient had DCCV 3/19 but did not hold sinus. Flecanide increased to 100 BID. Severe OSA noted and CPAP recommended prior to repeating DCCV. Exercise treadmill on 08/03/2013 showed no exercise induced ventricular arrhythmias. Since he was last seen, He denies dyspnea, chest pain or palpitations. Mild pedal edema. He is now on CPAP and is sleeping much better. His energy has improved.  Current Outpatient Prescriptions   Medication  Sig  Dispense  Refill   .  apixaban (ELIQUIS) 5 MG TABS tablet  Take 1 tablet (5 mg total) by mouth 2 (two) times daily.  60 tablet  11   .  atorvastatin (LIPITOR) 20 MG tablet  Take 20 mg by mouth every morning.     .  cholecalciferol (VITAMIN D) 1000 UNITS tablet  Take 1,000 Units by mouth every morning.     .  diltiazem (DILTIAZEM HCL CD) 360 MG 24 hr capsule  TAKE 1 CAPSULE (360 MG TOTAL) BY MOUTH DAILY.  30 capsule  1   .  fish oil-omega-3 fatty acids 1000 MG capsule  Take 3,000 mg by mouth every morning.     .  flecainide (TAMBOCOR) 50 MG tablet  Take 2 tablets twice daily  180 tablet  3   .  losartan (COZAAR) 50 MG tablet  Take 1 tablet (50 mg total) by mouth daily.  90 tablet  3   .  metoprolol succinate (TOPROL XL) 50 MG 24 hr tablet  Take 1 tablet (50 mg total) by mouth daily.  90 tablet  3   .  Omeprazole-Sodium  Bicarbonate (ZEGERID) 20-1100 MG CAPS capsule  Take 1 capsule by mouth daily.     .  potassium chloride SA (K-DUR,KLOR-CON) 20 MEQ tablet  Take 1 tablet (20 mEq total) by mouth 2 (two) times daily.  90 tablet  3   .  triamterene-hydrochlorothiazide (DYAZIDE) 37.5-25 MG per capsule  Take 1 capsule by mouth every morning.     .  valACYclovir (VALTREX) 500 MG tablet  Take 500 mg by mouth daily.      No current facility-administered medications for this visit.    Past Medical History   Diagnosis  Date   .  Hypertension  2008   .  Hyperlipidemia    .  Depression    .  GERD (gastroesophageal reflux disease)    .  Atrial fibrillation     Past Surgical History   Procedure  Laterality  Date   .  Appendectomy     .  Cardioversion  N/A  07/29/2013     Procedure: CARDIOVERSION; Surgeon: Sueanne Margarita, MD; Location: [facility]; Service: Cardiovascular; Laterality: N/A;    History    Social History   .  Marital Status:  Married     Spouse Name:  N/A     Number of Children:  N/A   .  Years of Education:  N/A    Occupational History   .       General Contractor    Social History Main Topics   .  Smoking status:  Former Smoker -- 3 years     Types:  Cigars     Quit date:  09/07/2011   .  Smokeless tobacco:  Never Used   .  Alcohol Use:  21.0 oz/week     7 Glasses of wine, 28 Cans of beer per week      Comment: 2 drinks/nightly   .  Drug Use:  No   .  Sexual Activity:  Not on file    Other Topics  Concern   .  Not on file    Social History Narrative   .  No narrative on file   ROS: no fevers or chills, productive cough, hemoptysis, dysphasia, odynophagia, melena, hematochezia, dysuria, hematuria, rash, seizure activity, orthopnea, PND, pedal edema, claudication. Remaining systems are negative.  Physical Exam:  Well-developed well-nourished in no acute distress.  Skin is warm and dry.  HEENT is normal.  Neck is supple.  Chest is clear to auscultation with normal expansion.    Cardiovascular exam is irregular  Abdominal exam nontender or distended. No masses palpated.  Extremities show Trace to 1+ edema.  neuro grossly intact  ECG Atrial fibrillation at a rate of 82. No significant ST changes.    Atrial fibrillation with RVR - Assessment & Plan Note    Patient is status post cardioversion. He did not hold sinus rhythm. His flecainide was increased to 100 mg twice a day. Followup exercise treadmill did not show exercise induced ventricular arrhythmias. He has significant sleep apnea. This has now been treated. We will plan to proceed with cardioversion in 4 weeks. If he does not hold sinus rhythm we will need to decide between rate control versus rhythm control. Given his young age I would like to maintain sinus rhythm if possible. His atrial fibrillation could also be contributing to his fatigue but this has improved with Cpap. Continue Cardizem and Toprol 50 mg daily. Continue apixaban.

## 2013-11-01 NOTE — Transfer of Care (Signed)
Immediate Anesthesia Transfer of Care Note  Patient: Norman Sims  Procedure(s) Performed: Procedure(s): CARDIOVERSION (N/A)  Patient Location: PACU  Anesthesia Type:MAC  Level of Consciousness: awake, alert , oriented and patient cooperative  Airway & Oxygen Therapy: Patient Spontanous Breathing and Patient connected to nasal cannula oxygen  Post-op Assessment: Report given to PACU RN, Post -op Vital signs reviewed and stable and Patient moving all extremities  Post vital signs: Reviewed and stable  Complications: No apparent anesthesia complications

## 2013-11-01 NOTE — Anesthesia Preprocedure Evaluation (Signed)
Anesthesia Evaluation  Patient identified by MRN, date of birth, ID band Patient awake    Reviewed: Allergy & Precautions, H&P , NPO status , Patient's Chart, lab work & pertinent test results, reviewed documented beta blocker date and time   Airway Mallampati: II TM Distance: >3 FB     Dental  (+) Teeth Intact, Dental Advisory Given   Pulmonary neg pulmonary ROS, sleep apnea and Continuous Positive Airway Pressure Ventilation , former smoker,  breath sounds clear to auscultation        Cardiovascular hypertension, Pt. on medications and Pt. on home beta blockers + dysrhythmias Atrial Fibrillation Rhythm:Irregular  Echo 04/07/13 EF 55%   Neuro/Psych Anxiety negative neurological ROS     GI/Hepatic Neg liver ROS, GERD-  Medicated and Controlled,  Endo/Other  negative endocrine ROS  Renal/GU negative Renal ROS     Musculoskeletal negative musculoskeletal ROS (+)   Abdominal (+)  Abdomen: soft. Bowel sounds: normal.  Peds  Hematology negative hematology ROS (+)   Anesthesia Other Findings   Reproductive/Obstetrics negative OB ROS                           Anesthesia Physical Anesthesia Plan  ASA: III  Anesthesia Plan: MAC   Post-op Pain Management:    Induction: Intravenous  Airway Management Planned: Mask and Natural Airway  Additional Equipment:   Intra-op Plan:   Post-operative Plan:   Informed Consent: I have reviewed the patients History and Physical, chart, labs and discussed the procedure including the risks, benefits and alternatives for the proposed anesthesia with the patient or authorized representative who has indicated his/her understanding and acceptance.   Dental advisory given  Plan Discussed with: CRNA, Anesthesiologist and Surgeon  Anesthesia Plan Comments:         Anesthesia Quick Evaluation

## 2013-11-01 NOTE — Discharge Instructions (Addendum)
Cardioversion Care After Refer to this sheet in the next few weeks. These instructions provide you with information on caring for yourself after your procedure. Your health care provider may also give you more specific instructions. Your treatment has been planned according to current medical practices, but problems sometimes occur. Call your health care provider if you have any problems or questions after your procedure. WHAT TO EXPECT AFTER THE PROCEDURE After the procedure, it is typical to experience:  Sleepiness.  Nausea and vomiting. HOME CARE INSTRUCTIONS  For the first 24 hours after anesthesia:  Have a responsible person with you.  Do not drive a car. If you are alone, do not take public transportation.  Do not drink alcohol.  Do not take medicine that has not been prescribed by your health care provider.  Do not sign important papers or make important decisions.  You may resume a normal diet and activities as directed by your health care provider.  Change bandages (dressings) as directed.  If you have questions or problems that seem related to general anesthesia, call the hospital and ask for the anesthetist or anesthesiologist on call. SEEK MEDICAL CARE IF:  You have nausea and vomiting that continue the day after anesthesia.  You develop a rash. SEEK IMMEDIATE MEDICAL CARE IF:   You have difficulty breathing.  You have chest pain.  You have any allergic problems. Document Released: 08/05/2000 Document Revised: 05/04/2013 Document Reviewed: 11/12/2012 [facility] Patient Information 2015 [place], [place]. This information is not intended to replace advice given to you by your health care provider. Make sure you discuss any questions you have with your health care provider.

## 2013-11-01 NOTE — Anesthesia Postprocedure Evaluation (Signed)
  Anesthesia Post-op Note  Patient: Norman Sims  Procedure(s) Performed: Procedure(s): CARDIOVERSION (N/A)  Patient Location: PACU and Endoscopy Unit  Anesthesia Type:MAC  Level of Consciousness: awake, alert , oriented and patient cooperative  Airway and Oxygen Therapy: Patient Spontanous Breathing  Post-op Pain: none  Post-op Assessment: Post-op Vital signs reviewed, Patient's Cardiovascular Status Stable, Respiratory Function Stable, Patent Airway and No signs of Nausea or vomiting  Post-op Vital Signs: Reviewed and stable  Last Vitals:  Filed Vitals:   11/01/13 0830  BP: 135/89  Pulse: 74  Temp: 36.6 C  Resp: 17    Complications: No apparent anesthesia complications

## 2013-11-01 NOTE — Interval H&P Note (Signed)
History and Physical Interval Note:  11/01/2013 8:07 AM  Norman Sims  has presented today for surgery, with the diagnosis of AFIB  The various methods of treatment have been discussed with the patient and family. After consideration of risks, benefits and other options for treatment, the patient has consented to  Procedure(s): CARDIOVERSION (N/A) as a surgical intervention .  The patient's history has been reviewed, patient examined, no change in status, stable for surgery.  I have reviewed the patient's chart and labs.  Questions were answered to the patient's satisfaction.     [name],[name]

## 2013-11-02 ENCOUNTER — Encounter (HOSPITAL_COMMUNITY): Payer: Self-pay | Admitting: Cardiology

## 2013-11-10 ENCOUNTER — Ambulatory Visit: Payer: BC Managed Care – PPO | Admitting: Cardiology

## 2013-11-10 ENCOUNTER — Ambulatory Visit: Payer: BC Managed Care – PPO | Admitting: Pulmonary Disease

## 2013-11-24 NOTE — Telephone Encounter (Signed)
Encounter complete. 

## 2013-11-25 NOTE — Telephone Encounter (Signed)
Encounter complete. 

## 2013-11-29 ENCOUNTER — Ambulatory Visit: Payer: BC Managed Care – PPO | Admitting: Pulmonary Disease

## 2013-11-30 ENCOUNTER — Ambulatory Visit (INDEPENDENT_AMBULATORY_CARE_PROVIDER_SITE_OTHER): Payer: BC Managed Care – PPO | Admitting: Pulmonary Disease

## 2013-11-30 ENCOUNTER — Encounter: Payer: Self-pay | Admitting: Pulmonary Disease

## 2013-11-30 VITALS — BP 108/58 | HR 54 | Temp 98.4°F | Ht 71.5 in | Wt 280.0 lb

## 2013-11-30 DIAGNOSIS — G4733 Obstructive sleep apnea (adult) (pediatric): Secondary | ICD-10-CM

## 2013-11-30 NOTE — Progress Notes (Signed)
   Subjective:    Patient ID: Norman Sims, male    DOB: [birth year], 53 y.o.   MRN: [id number]  HPI The patient comes in today for followup of his known obstructive sleep apnea. He is wearing CPAP compliantly by his download, and has seen a dramatic difference in his sleep and daytime alertness. His download does show increased leak, and the patient has been working on this.   Review of Systems  Constitutional: Negative for fever and unexpected weight change.  HENT: Negative for congestion, dental problem, ear pain, nosebleeds, postnasal drip, rhinorrhea, sinus pressure, sneezing, sore throat and trouble swallowing.   Eyes: Negative for redness and itching.  Respiratory: Negative for cough, chest tightness, shortness of breath and wheezing.   Cardiovascular: Negative for palpitations and leg swelling.  Gastrointestinal: Negative for nausea and vomiting.  Genitourinary: Negative for dysuria.  Musculoskeletal: Negative for joint swelling.  Skin: Negative for rash.  Neurological: Negative for headaches.  Hematological: Does not bruise/bleed easily.  Psychiatric/Behavioral: Negative for dysphoric mood. The patient is not nervous/anxious.        Objective:   Physical Exam Overweight male in no acute distress Nose without purulence or discharge noted No skin breakdown or pressure necrosis from the CPAP mask Neck without lymphadenopathy or thyromegaly Lower extremities without significant edema, no cyanosis Alert and oriented, moves all 4 extremities.       Assessment & Plan:

## 2013-11-30 NOTE — Assessment & Plan Note (Signed)
The pt is doing well with cpap overall, and feels he is sleeping better with improved daytime alertness.  He is having some mask leak by his download, and I have asked him to work with his homecare company on this.  If he continues to have issues, would refer to sleep center for a mask fitting session.  I've asked the patient to continue working on weight loss, and will see him back in 6 months.

## 2013-11-30 NOTE — Patient Instructions (Signed)
Work on [employer] fit with your home care company, but call if you are not successful.   Work on weight loss. followup with me again in 47mos.

## 2013-12-23 ENCOUNTER — Encounter: Payer: BC Managed Care – PPO | Admitting: Cardiology

## 2013-12-23 NOTE — Progress Notes (Signed)
HPI: FU atrial fibrillation. [facility]2013 with SOB and cough. He was found to have rapid atrial fibrillation and was sent to [facility]. He was admitted and started on IV diltiazem for rate control. Within 24 hours he spontaneously converted to SR. He was started on Eliquis and diltiazem. An echo was done revealing normal LV function and no significant valvular abnormality; mild biatrial enlargement. His cough was felt to be related to ACEI; therefore, this was discontinued. Nuclear study in February of 2015 showed normal perfusion. Afib recurred; flecanide and toprol added. Patient had DCCV 3/19 but did not hold sinus. Flecanide increased to 100 BID. Severe OSA noted and CPAP recommended prior to repeating DCCV. Exercise treadmill on 08/03/2013 showed no exercise induced ventricular arrhythmias. Repeat DCCV 11/01/13. Since last seen,    Current Outpatient Prescriptions  Medication Sig Dispense Refill  . apixaban (ELIQUIS) 5 MG TABS tablet Take 1 tablet (5 mg total) by mouth 2 (two) times daily.  60 tablet  11  . atorvastatin (LIPITOR) 20 MG tablet Take 20 mg by mouth every morning.       . cholecalciferol (VITAMIN D) 1000 UNITS tablet Take 1,000 Units by mouth every morning.      . diltiazem (CARDIZEM CD) 360 MG 24 hr capsule Take 1 capsule (360 mg total) by mouth daily.  30 capsule  6  . fish oil-omega-3 fatty acids 1000 MG capsule Take 3,000 mg by mouth every morning.       . flecainide (TAMBOCOR) 50 MG tablet Take 100 mg by mouth 2 (two) times daily.      Marland Kitchen losartan (COZAAR) 50 MG tablet Take 1 tablet (50 mg total) by mouth daily.  90 tablet  3  . metoprolol succinate (TOPROL XL) 50 MG 24 hr tablet Take 1 tablet (50 mg total) by mouth daily.  90 tablet  3  . Omeprazole-Sodium Bicarbonate (ZEGERID) 20-1100 MG CAPS capsule Take 1 capsule by mouth daily.       Marland Kitchen triamterene-hydrochlorothiazide (DYAZIDE) 37.5-25 MG per capsule Take 1 capsule by mouth every morning.         . valACYclovir (VALTREX) 500 MG tablet Take 500 mg by mouth daily as needed (for an occurance).        No current facility-administered medications for this visit.     Past Medical History  Diagnosis Date  . Hypertension 2008  . Hyperlipidemia   . Depression   . GERD (gastroesophageal reflux disease)   . Atrial fibrillation     Past Surgical History  Procedure Laterality Date  . Appendectomy    . Cardioversion N/A 07/29/2013    Procedure: CARDIOVERSION;  Surgeon: Sueanne Margarita, MD;  Location: [place];  Service: Cardiovascular;  Laterality: N/A;  . Cardioversion N/A 11/01/2013    Procedure: CARDIOVERSION;  Surgeon: Sueanne Margarita, MD;  Location: [facility];  Service: Cardiovascular;  Laterality: N/A;    History   Social History  . Marital Status: Married    Spouse Name: N/A    Number of Children: N/A  . Years of Education: N/A   Occupational History  .      General Contractor    Social History Main Topics  . Smoking status: Former Smoker -- 3 years    Types: Cigars    Quit date: 09/07/2011  . Smokeless tobacco: Never Used  . Alcohol Use: 21.0 oz/week    7 Glasses of wine, 28 Cans of beer per week  Comment: 2 glasses of wine or mixed drink every 3 days  . Drug Use: No  . Sexual Activity: Not on file   Other Topics Concern  . Not on file   Social History Narrative  . No narrative on file    ROS: no fevers or chills, productive cough, hemoptysis, dysphasia, odynophagia, melena, hematochezia, dysuria, hematuria, rash, seizure activity, orthopnea, PND, pedal edema, claudication. Remaining systems are negative.  Physical Exam: Well-developed well-nourished in no acute distress.  Skin is warm and dry.  HEENT is normal.  Neck is supple.  Chest is clear to auscultation with normal expansion.  Cardiovascular exam is regular rate and rhythm.  Abdominal exam nontender or distended. No masses palpated. Extremities show no edema. neuro grossly  intact  ECG     This encounter was created in error - please disregard.

## 2013-12-27 ENCOUNTER — Telehealth: Payer: Self-pay | Admitting: Cardiology

## 2013-12-27 NOTE — Telephone Encounter (Signed)
Norman Sims missed his appt on 12/23/13 because he thought it was on the 12/28/13 and wants to know if he should come in before October because his heart had to be shocked and placed back in rhythm .  Thanks

## 2013-12-27 NOTE — Telephone Encounter (Signed)
Spoke with pt, Follow up scheduled  

## 2013-12-30 ENCOUNTER — Encounter: Payer: BC Managed Care – PPO | Admitting: Cardiology

## 2013-12-30 NOTE — Progress Notes (Signed)
HPI: FU atrial fibrillation. [facility] with SOB and cough. He was found to have rapid atrial fibrillation and was sent to [facility]. He was admitted and started on IV diltiazem for rate control. Within 24 hours he spontaneously converted to SR. He was started on Eliquis and diltiazem. An echo was done revealing normal LV function and no significant valvular abnormality; mild biatrial enlargement. His cough was felt to be related to ACEI; therefore, this was discontinued. He was instructed to decrease his alcohol intake. Nuclear study in February of 2015 showed normal perfusion. Afib recurred; flecanide and toprol added. Patient had DCCV 3/19 but did not hold sinus. Flecanide increased to 100 BID. Severe OSA noted and CPAP recommended prior to repeating DCCV. Exercise treadmill on 08/03/2013 showed no exercise induced ventricular arrhythmias. Repeat DCCV 6/15. Since then,    Current Outpatient Prescriptions  Medication Sig Dispense Refill  . apixaban (ELIQUIS) 5 MG TABS tablet Take 1 tablet (5 mg total) by mouth 2 (two) times daily.  60 tablet  11  . atorvastatin (LIPITOR) 20 MG tablet Take 20 mg by mouth every morning.       . cholecalciferol (VITAMIN D) 1000 UNITS tablet Take 1,000 Units by mouth every morning.      . diltiazem (CARDIZEM CD) 360 MG 24 hr capsule Take 1 capsule (360 mg total) by mouth daily.  30 capsule  6  . fish oil-omega-3 fatty acids 1000 MG capsule Take 3,000 mg by mouth every morning.       . flecainide (TAMBOCOR) 50 MG tablet Take 100 mg by mouth 2 (two) times daily.      Marland Kitchen losartan (COZAAR) 50 MG tablet Take 1 tablet (50 mg total) by mouth daily.  90 tablet  3  . metoprolol succinate (TOPROL XL) 50 MG 24 hr tablet Take 1 tablet (50 mg total) by mouth daily.  90 tablet  3  . Omeprazole-Sodium Bicarbonate (ZEGERID) 20-1100 MG CAPS capsule Take 1 capsule by mouth daily.       Marland Kitchen triamterene-hydrochlorothiazide (DYAZIDE) 37.5-25 MG per  capsule Take 1 capsule by mouth every morning.        . valACYclovir (VALTREX) 500 MG tablet Take 500 mg by mouth daily as needed (for an occurance).        No current facility-administered medications for this visit.     Past Medical History  Diagnosis Date  . Hypertension 2008  . Hyperlipidemia   . Depression   . GERD (gastroesophageal reflux disease)   . Atrial fibrillation     Past Surgical History  Procedure Laterality Date  . Appendectomy    . Cardioversion N/A 07/29/2013    Procedure: CARDIOVERSION;  Surgeon: Sueanne Margarita, MD;  Location: [place];  Service: Cardiovascular;  Laterality: N/A;  . Cardioversion N/A 11/01/2013    Procedure: CARDIOVERSION;  Surgeon: Sueanne Margarita, MD;  Location: [facility];  Service: Cardiovascular;  Laterality: N/A;    History   Social History  . Marital Status: Married    Spouse Name: N/A    Number of Children: N/A  . Years of Education: N/A   Occupational History  .      General Contractor    Social History Main Topics  . Smoking status: Former Smoker -- 3 years    Types: Cigars    Quit date: 09/07/2011  . Smokeless tobacco: Never Used  . Alcohol Use: 21.0 oz/week    7 Glasses of  wine, 28 Cans of beer per week     Comment: 2 glasses of wine or mixed drink every 3 days  . Drug Use: No  . Sexual Activity: Not on file   Other Topics Concern  . Not on file   Social History Narrative  . No narrative on file    ROS: no fevers or chills, productive cough, hemoptysis, dysphasia, odynophagia, melena, hematochezia, dysuria, hematuria, rash, seizure activity, orthopnea, PND, pedal edema, claudication. Remaining systems are negative.  Physical Exam: Well-developed well-nourished in no acute distress.  Skin is warm and dry.  HEENT is normal.  Neck is supple.  Chest is clear to auscultation with normal expansion.  Cardiovascular exam is regular rate and rhythm.  Abdominal exam nontender or distended. No masses  palpated. Extremities show no edema. neuro grossly intact  ECG     This encounter was created in error - please disregard.

## 2014-02-02 ENCOUNTER — Encounter: Payer: Self-pay | Admitting: Gastroenterology

## 2014-02-17 ENCOUNTER — Encounter: Payer: Self-pay | Admitting: Cardiology

## 2014-02-17 ENCOUNTER — Ambulatory Visit (INDEPENDENT_AMBULATORY_CARE_PROVIDER_SITE_OTHER): Payer: 59 | Admitting: Cardiology

## 2014-02-17 VITALS — BP 118/70 | HR 59 | Ht 72.0 in | Wt 271.1 lb

## 2014-02-17 DIAGNOSIS — I4891 Unspecified atrial fibrillation: Secondary | ICD-10-CM

## 2014-02-17 DIAGNOSIS — I10 Essential (primary) hypertension: Secondary | ICD-10-CM

## 2014-02-17 DIAGNOSIS — E785 Hyperlipidemia, unspecified: Secondary | ICD-10-CM

## 2014-02-17 LAB — CBC
HCT: 44 % (ref 39.0–52.0)
Hemoglobin: 15.1 g/dL (ref 13.0–17.0)
MCH: 31.5 pg (ref 26.0–34.0)
MCHC: 34.3 g/dL (ref 30.0–36.0)
MCV: 91.9 fL (ref 78.0–100.0)
PLATELETS: 210 10*3/uL (ref 150–400)
RBC: 4.79 MIL/uL (ref 4.22–5.81)
RDW: 14.4 % (ref 11.5–15.5)
WBC: 5.9 10*3/uL (ref 4.0–10.5)

## 2014-02-17 LAB — BASIC METABOLIC PANEL WITH GFR
BUN: 15 mg/dL (ref 6–23)
CALCIUM: 9.4 mg/dL (ref 8.4–10.5)
CHLORIDE: 104 meq/L (ref 96–112)
CO2: 28 meq/L (ref 19–32)
CREATININE: 0.92 mg/dL (ref 0.50–1.35)
GFR, Est African American: >89 mL/min
Glucose, Bld: 95 mg/dL (ref 70–99)
Potassium: 3.9 mEq/L (ref 3.5–5.3)
SODIUM: 140 meq/L (ref 135–145)

## 2014-02-17 LAB — HEPATIC FUNCTION PANEL
ALT: 21 U/L (ref 0–53)
AST: 19 U/L (ref 0–37)
Albumin: 4.2 g/dL (ref 3.5–5.2)
Alkaline Phosphatase: 75 U/L (ref 39–117)
Bilirubin, Direct: 0.3 mg/dL (ref 0.0–0.3)
Indirect Bilirubin: 1.1 mg/dL (ref 0.2–1.2)
Total Bilirubin: 1.4 mg/dL — ABNORMAL HIGH (ref 0.2–1.2)
Total Protein: 6.5 g/dL (ref 6.0–8.3)

## 2014-02-17 NOTE — Assessment & Plan Note (Signed)
Blood pressure controlled. Continue present medications. Check potassium and renal function. 

## 2014-02-17 NOTE — Assessment & Plan Note (Addendum)
Patient is status post cardioversion. He did not hold sinus rhythm. He is in atrial flutter today. I am not convinced he is particular symptomatic. I discussed options today including rate control/anticoagulation versus attempt at ablation. Given that he is relatively asymptomatic I would favor rate control. We will see him back in 3 months to make sure that his symptoms are stable. Continue Cardizem and Toprol. Continue flecainide. Reassess in 3 months to see if sinus rhythm is restored. Continue apixaban. Check hemoglobin and renal function.

## 2014-02-17 NOTE — Patient Instructions (Signed)
Your physician wants you to follow-up in: 3 MONTHS WITH DR CRENSHAW You will receive a reminder letter in the mail two months in advance. If you don't receive a letter, please call our office to schedule the follow-up appointment.   Your physician recommends that you HAVE LAB WORK TODAY 

## 2014-02-17 NOTE — Progress Notes (Signed)
HPI: FU atrial fibrillation. [facility] with SOB and cough. He was found to have rapid atrial fibrillation and was sent to [facility]. He was admitted and started on IV diltiazem for rate control. Within 24 hours he spontaneously converted to SR. He was started on Eliquis and diltiazem. An echo was done revealing normal LV function and no significant valvular abnormality; mild biatrial enlargement. His cough was felt to be related to ACEI; therefore, this was discontinued. He was instructed to decrease his alcohol intake. Nuclear study in February of 2015 showed normal perfusion. Afib recurred; flecanide and toprol added. Patient had DCCV 3/19 but did not hold sinus. Flecanide increased to 100 BID. Severe OSA noted and CPAP recommended prior to repeating DCCV. Exercise treadmill on 08/03/2013 showed no exercise induced ventricular arrhythmias. Repeat DCCV 6/15 to sinus. Since he was last seen, He denies dyspnea, chest pain or palpitations. No syncope or bleeding. Occasional mild pedal edema.  Current Outpatient Prescriptions  Medication Sig Dispense Refill  . apixaban (ELIQUIS) 5 MG TABS tablet Take 1 tablet (5 mg total) by mouth 2 (two) times daily.  60 tablet  11  . atorvastatin (LIPITOR) 20 MG tablet Take 20 mg by mouth every morning.       . cholecalciferol (VITAMIN D) 1000 UNITS tablet Take 1,000 Units by mouth every morning.      . diltiazem (CARDIZEM CD) 360 MG 24 hr capsule Take 1 capsule (360 mg total) by mouth daily.  30 capsule  6  . fish oil-omega-3 fatty acids 1000 MG capsule Take 3,000 mg by mouth every morning.       . flecainide (TAMBOCOR) 50 MG tablet Take 100 mg by mouth 2 (two) times daily.      Marland Kitchen losartan (COZAAR) 50 MG tablet Take 1 tablet (50 mg total) by mouth daily.  90 tablet  3  . metoprolol succinate (TOPROL XL) 50 MG 24 hr tablet Take 1 tablet (50 mg total) by mouth daily.  90 tablet  3  . Omeprazole-Sodium Bicarbonate (ZEGERID) 20-1100  MG CAPS capsule Take 1 capsule by mouth daily.       Marland Kitchen triamterene-hydrochlorothiazide (DYAZIDE) 37.5-25 MG per capsule Take 1 capsule by mouth every morning.        . valACYclovir (VALTREX) 500 MG tablet Take 500 mg by mouth daily as needed (for an occurance).        No current facility-administered medications for this visit.     Past Medical History  Diagnosis Date  . Hypertension 2008  . Hyperlipidemia   . Depression   . GERD (gastroesophageal reflux disease)   . Atrial fibrillation     Past Surgical History  Procedure Laterality Date  . Appendectomy    . Cardioversion N/A 07/29/2013    Procedure: CARDIOVERSION;  Surgeon: Sueanne Margarita, MD;  Location: [place];  Service: Cardiovascular;  Laterality: N/A;  . Cardioversion N/A 11/01/2013    Procedure: CARDIOVERSION;  Surgeon: Sueanne Margarita, MD;  Location: [facility];  Service: Cardiovascular;  Laterality: N/A;    History   Social History  . Marital Status: Married    Spouse Name: N/A    Number of Children: N/A  . Years of Education: N/A   Occupational History  .      General Contractor    Social History Main Topics  . Smoking status: Former Smoker -- 3 years    Types: Cigars    Quit date: 09/07/2011  .  Smokeless tobacco: Never Used  . Alcohol Use: 21.0 oz/week    7 Glasses of wine, 28 Cans of beer per week     Comment: 2 glasses of wine or mixed drink every 3 days  . Drug Use: No  . Sexual Activity: Not on file   Other Topics Concern  . Not on file   Social History Narrative  . No narrative on file    ROS: no fevers or chills, productive cough, hemoptysis, dysphasia, odynophagia, melena, hematochezia, dysuria, hematuria, rash, seizure activity, orthopnea, PND, claudication. Remaining systems are negative.  Physical Exam: Well-developed well-nourished in no acute distress.  Skin is warm and dry.  HEENT is normal.  Neck is supple.  Chest is clear to auscultation with normal expansion.    Cardiovascular exam is irregular Abdominal exam nontender or distended. No masses palpated. Extremities show trace edema. neuro grossly intact  ECG Atrial flutter with no ST changes.

## 2014-02-17 NOTE — Assessment & Plan Note (Signed)
Continue statin. Check LFTs.

## 2014-03-14 ENCOUNTER — Encounter: Payer: Self-pay | Admitting: Gastroenterology

## 2014-03-21 ENCOUNTER — Telehealth: Payer: Self-pay | Admitting: *Deleted

## 2014-03-21 ENCOUNTER — Encounter: Payer: Self-pay | Admitting: Gastroenterology

## 2014-03-21 ENCOUNTER — Ambulatory Visit (INDEPENDENT_AMBULATORY_CARE_PROVIDER_SITE_OTHER): Payer: 59 | Admitting: Gastroenterology

## 2014-03-21 VITALS — BP 150/92 | HR 92 | Ht 70.75 in | Wt 276.2 lb

## 2014-03-21 DIAGNOSIS — Z86010 Personal history of colonic polyps: Secondary | ICD-10-CM

## 2014-03-21 DIAGNOSIS — Z7901 Long term (current) use of anticoagulants: Secondary | ICD-10-CM | POA: Insufficient documentation

## 2014-03-21 MED ORDER — MOVIPREP 100 G PO SOLR: 1.0000 | Freq: Once | ORAL | Status: DC

## 2014-03-21 NOTE — Progress Notes (Signed)
Reviewed and agree with management plan.  Malcolm T. Stark, MD FACG 

## 2014-03-21 NOTE — Telephone Encounter (Signed)
03/21/2014   RE: Norman Sims DOB: [birth year] MRN: [id number]   Dear Dr. Stanford Breed,    We have scheduled the above patient for an Colonoscopy. Our records show that he is on anticoagulation therapy.   Please advise as to how long the patient may come off his therapy of Eliquis prior to the procedure, which is scheduled for 04-26-2014.  Please route the completed form to Evette Georges., CMA  Sincerely,    Hope Pigeon

## 2014-03-21 NOTE — Telephone Encounter (Signed)
DC apixaban 2 days prior to procedure and resume day after Norman Sims

## 2014-03-21 NOTE — Patient Instructions (Signed)

## 2014-03-21 NOTE — Telephone Encounter (Signed)
PATIENT NOTIFIED TO HOLD ELIQUIS 2 DAYS PRIOR TO PROCEDURE PATIENT VERBALIZED UNDERSTANDING

## 2014-03-21 NOTE — Progress Notes (Signed)
03/21/2014 Norman Sims [id number] 1961/02/27   HISTORY OF PRESENT ILLNESS:  This is a 53 year old male who is previously known to Norman Sims for colonoscopy in 08/2010.  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  Had mild diverticulosis and internal hemorrhoids as well.  It was recommended that he have a repeat colonoscopy in 3 years from that time.  He denies any GI complaints.  He is on Eliquis for the past 7 months or more for atrial fibrillation/flutter.  Last seen by Dr. Stanford Sims on 02/17/2014.   Past Medical History  Diagnosis Date  . Hypertension 2008  . Hyperlipidemia   . Depression   . GERD (gastroesophageal reflux disease)   . Atrial fibrillation   . Colon polyp    Past Surgical History  Procedure Laterality Date  . Appendectomy    . Cardioversion N/A 07/29/2013    Procedure: CARDIOVERSION;  Surgeon: Sueanne Margarita, MD;  Location[facility];  Service: Cardiovascular;  Laterality: N/A;  . Cardioversion N/A 11/01/2013    Procedure: CARDIOVERSION;  Surgeon: Sueanne Margarita, MD;  Location: [place] [facility];  Service: Cardiovascular;  Laterality: N/A;    reports that he quit smoking about 2 years ago. His smoking use included Cigars. He has never used smokeless tobacco. He reports that he drinks about 21.0 oz of alcohol per week. He reports that he does not use illicit drugs. family history includes Allergies in his father and sister; CAD in his father and another family member; Diabetes in his paternal aunt and paternal uncle; Hypertension in his father; Lung cancer in his father; Pneumonia in his mother. There is no history of Colon cancer, Colon polyps, Kidney disease, or Esophageal cancer. No Known Allergies    Outpatient Encounter Prescriptions as of 03/21/2014  Medication Sig  . apixaban (ELIQUIS) 5 MG TABS tablet Take 1 tablet (5 mg total) by mouth 2 (two) times daily.  Marland Kitchen atorvastatin  (LIPITOR) 20 MG tablet Take 20 mg by mouth every morning.   . cholecalciferol (VITAMIN D) 1000 UNITS tablet Take 1,000 Units by mouth every morning.  . diltiazem (CARDIZEM CD) 360 MG 24 hr capsule Take 1 capsule (360 mg total) by mouth daily.  . fish oil-omega-3 fatty acids 1000 MG capsule Take 3,000 mg by mouth every morning.   . flecainide (TAMBOCOR) 50 MG tablet Take 100 mg by mouth 2 (two) times daily.  Marland Kitchen losartan (COZAAR) 50 MG tablet Take 1 tablet (50 mg total) by mouth daily.  . metoprolol succinate (TOPROL XL) 50 MG 24 hr tablet Take 1 tablet (50 mg total) by mouth daily.  [name] (ZEGERID) 20-1100 MG CAPS capsule Take 1 capsule by mouth daily.   Marland Kitchen triamterene-hydrochlorothiazide (DYAZIDE) 37.5-25 MG per capsule Take 1 capsule by mouth every morning.    . valACYclovir (VALTREX) 500 MG tablet Take 500 mg by mouth daily as needed (for an occurance).   . MOVIPREP 100 G SOLR Take 1 kit (200 g total) by mouth once.     REVIEW OF SYSTEMS  : All other systems reviewed and negative except where noted in the History of Present Illness.   PHYSICAL EXAM: BP 150/92 mmHg  Pulse 92  Ht 5' 10.75" (1.797 m)  Wt 276 lb 4 oz (125.306 kg)  BMI 38.80 kg/m2 General: Well developed white male in no acute distress Head: Normocephalic and atraumatic Eyes:  Sclerae anicteric,  conjunctiva pink. Ears: Normal auditory acuity Lungs: Clear throughout to auscultation Heart: Regular rate and rhythm Abdomen: Soft, non-distended.  Normal bowel sounds.  Non-tender. Rectal:  Will be done at the time of colonoscopy. Musculoskeletal: Symmetrical with no gross deformities  Skin: No lesions on visible extremities Extremities: No edema  Neurological: Alert oriented x 4, grossly non-focal Psychological:  Alert and cooperative. Normal mood and affect  ASSESSMENT AND Sims: -Personal history of colon polyps:  Will schedule colonoscopy with Norman Sims.  The risks, benefits, and alternatives  were discussed with the patient and he consents to proceed. -Chronic anti-coagulation with Eliquis for atrial fibrillation/flutter:  The risks benefits and alternatives to a temporary hold of anti-coagulants/anti-platelets for the procedure were discussed with the patient he consents to proceed. Obtain clearance from Dr. Stanford Sims for ok to hold Eliquis.

## 2014-03-24 ENCOUNTER — Encounter: Payer: Self-pay | Admitting: Gastroenterology

## 2014-04-24 ENCOUNTER — Other Ambulatory Visit: Payer: Self-pay | Admitting: Physician Assistant

## 2014-04-25 ENCOUNTER — Telehealth: Payer: Self-pay | Admitting: Gastroenterology

## 2014-04-25 NOTE — Telephone Encounter (Signed)
Yes, charge. 

## 2014-04-26 ENCOUNTER — Encounter: Payer: 59 | Admitting: Gastroenterology

## 2014-05-27 NOTE — Progress Notes (Signed)
HPI: FU atrial fibrillation. [facility] with SOB and cough. He was found to have rapid atrial fibrillation and was sent to [facility]. He was admitted and started on IV diltiazem for rate control. Within 24 hours he spontaneously converted to SR. He was started on Eliquis and diltiazem. An echo was done revealing normal LV function and no significant valvular abnormality; mild biatrial enlargement. His cough was felt to be related to ACEI; therefore, this was discontinued. He was instructed to decrease his alcohol intake. Nuclear study in February of 2015 showed normal perfusion. Afib recurred; flecanide and toprol added. Patient had DCCV 3/15 but did not hold sinus. Flecanide increased to 100 BID. Severe OSA noted and CPAP recommended prior to repeating DCCV. Exercise treadmill on 08/03/2013 showed no exercise induced ventricular arrhythmias. Repeat DCCV 6/15 to sinus. However patient was in atrial flutter and at last office visit we continued with rate control and anticoagulation. Since he was last seen, he decreased his alcohol used dramatically and he felt himself go back into sinus. With that he notes markedly improved energy. There is no dyspnea, chest pain or syncope.  Current Outpatient Prescriptions  Medication Sig Dispense Refill  . atorvastatin (LIPITOR) 20 MG tablet Take 20 mg by mouth every morning.     . cholecalciferol (VITAMIN D) 1000 UNITS tablet Take 1,000 Units by mouth every morning.    . diltiazem (CARDIZEM CD) 360 MG 24 hr capsule Take 1 capsule (360 mg total) by mouth daily. 30 capsule 6  . ELIQUIS 5 MG TABS tablet TAKE 1 TABLET BY MOUTH TWICE DAILY 60 tablet 11  . fish oil-omega-3 fatty acids 1000 MG capsule Take 3,000 mg by mouth every morning.     . flecainide (TAMBOCOR) 50 MG tablet Take 100 mg by mouth 2 (two) times daily.    Marland Kitchen losartan (COZAAR) 50 MG tablet Take 1 tablet (50 mg total) by mouth daily. 90 tablet 3  . metoprolol succinate  (TOPROL XL) 50 MG 24 hr tablet Take 1 tablet (50 mg total) by mouth daily. 90 tablet 3  . MOVIPREP 100 G SOLR Take 1 kit (200 g total) by mouth once. 1 kit 0  . Omeprazole-Sodium Bicarbonate (ZEGERID) 20-1100 MG CAPS capsule Take 1 capsule by mouth daily.     Marland Kitchen triamterene-hydrochlorothiazide (DYAZIDE) 37.5-25 MG per capsule Take 1 capsule by mouth every morning.      . valACYclovir (VALTREX) 500 MG tablet Take 500 mg by mouth daily as needed (for an occurance).      No current facility-administered medications for this visit.     Past Medical History  Diagnosis Date  . Hypertension 2008  . Hyperlipidemia   . Depression   . GERD (gastroesophageal reflux disease)   . Atrial fibrillation   . Colon polyp     Past Surgical History  Procedure Laterality Date  . Appendectomy    . Cardioversion N/A 07/29/2013    Procedure: CARDIOVERSION;  Surgeon: Sueanne Margarita, MD;  Location: [place];  Service: Cardiovascular;  Laterality: N/A;  . Cardioversion N/A 11/01/2013    Procedure: CARDIOVERSION;  Surgeon: Sueanne Margarita, MD;  Location: [facility];  Service: Cardiovascular;  Laterality: N/A;    History   Social History  . Marital Status: Married    Spouse Name: N/A    Number of Children: 2  . Years of Education: N/A   Occupational History  . General Contractor     General  Contractor    Social History Main Topics  . Smoking status: Former Smoker -- 3 years    Types: Cigars    Quit date: 09/07/2011  . Smokeless tobacco: Never Used  . Alcohol Use: 21.0 oz/week    7 Glasses of wine, 28 Cans of beer per week     Comment: 2 glasses of wine or mixed drink every 3 days  . Drug Use: No  . Sexual Activity: Not on file   Other Topics Concern  . Not on file   Social History Narrative    ROS: no fevers or chills, productive cough, hemoptysis, dysphasia, odynophagia, melena, hematochezia, dysuria, hematuria, rash, seizure activity, orthopnea, PND, pedal edema, claudication.  Remaining systems are negative.  Physical Exam: Well-developed well-nourished in no acute distress.  Skin is warm and dry.  HEENT is normal.  Neck is supple.  Chest is clear to auscultation with normal expansion.  Cardiovascular exam is regular rate and rhythm.  Abdominal exam nontender or distended. No masses palpated. Extremities show no edema. neuro grossly intact  ECG sinus bradycardia at a rate of 50. No ST changes.

## 2014-05-30 ENCOUNTER — Ambulatory Visit (INDEPENDENT_AMBULATORY_CARE_PROVIDER_SITE_OTHER): Payer: 59 | Admitting: Cardiology

## 2014-05-30 ENCOUNTER — Encounter: Payer: Self-pay | Admitting: Cardiology

## 2014-05-30 VITALS — BP 140/82 | HR 50 | Ht 71.0 in | Wt 278.3 lb

## 2014-05-30 DIAGNOSIS — E785 Hyperlipidemia, unspecified: Secondary | ICD-10-CM

## 2014-05-30 DIAGNOSIS — I10 Essential (primary) hypertension: Secondary | ICD-10-CM

## 2014-05-30 DIAGNOSIS — G4733 Obstructive sleep apnea (adult) (pediatric): Secondary | ICD-10-CM

## 2014-05-30 DIAGNOSIS — I4891 Unspecified atrial fibrillation: Secondary | ICD-10-CM

## 2014-05-30 NOTE — Assessment & Plan Note (Signed)
Continue statin. 

## 2014-05-30 NOTE — Patient Instructions (Signed)
Your physician wants you to follow-up in: 6 months with dr crenshaw You will receive a reminder letter in the mail two months in advance. If you don't receive a letter, please call our office to schedule the follow-up appointment.  

## 2014-05-30 NOTE — Assessment & Plan Note (Signed)
Patient has converted to sinus rhythm. He feels markedly improved. We will therefore try and maintain sinus rhythm. Continue flecainide. Continue Cardizem and beta blocker. If he has more frequent episodes in the future we can consider a different antiarrhythmic versus referral for ablation. His embolic risk factors include hypertension. I will continue apixaban.

## 2014-05-30 NOTE — Assessment & Plan Note (Signed)
Blood pressure controlled. Continue present medications. 

## 2014-05-30 NOTE — Assessment & Plan Note (Signed)
Patient instructed on the importance of continuing CPAP. I explained this increases risk of atrial fibrillation if not treated.

## 2014-06-01 ENCOUNTER — Encounter: Payer: Self-pay | Admitting: Gastroenterology

## 2014-06-03 ENCOUNTER — Encounter: Payer: 59 | Admitting: Gastroenterology

## 2014-06-07 ENCOUNTER — Ambulatory Visit: Payer: BC Managed Care – PPO | Admitting: Pulmonary Disease

## 2014-06-09 ENCOUNTER — Other Ambulatory Visit: Payer: Self-pay | Admitting: Cardiology

## 2014-06-17 ENCOUNTER — Encounter: Payer: Self-pay | Admitting: Gastroenterology

## 2014-06-24 ENCOUNTER — Encounter: Payer: 59 | Admitting: Gastroenterology

## 2014-07-18 ENCOUNTER — Other Ambulatory Visit: Payer: Self-pay | Admitting: *Deleted

## 2014-07-18 MED ORDER — DILTIAZEM HCL ER COATED BEADS 360 MG PO CP24: 360.0000 mg | ORAL_CAPSULE | Freq: Every day | ORAL | Status: DC

## 2014-08-01 ENCOUNTER — Ambulatory Visit (AMBULATORY_SURGERY_CENTER): Payer: Self-pay | Admitting: *Deleted

## 2014-08-01 VITALS — Ht 71.5 in | Wt 269.6 lb

## 2014-08-01 DIAGNOSIS — Z86010 Personal history of colonic polyps: Secondary | ICD-10-CM

## 2014-08-01 NOTE — Progress Notes (Signed)
No egg or soy allergy No diet pills no home 02 use but uses c pap No issues with past sedation A fib under control, on meds emmi video to e mail Pt has a movi prep kit from November where he had to [facility]

## 2014-08-18 ENCOUNTER — Encounter: Payer: Self-pay | Admitting: Gastroenterology

## 2014-08-19 ENCOUNTER — Encounter: Payer: Self-pay | Admitting: Gastroenterology

## 2014-08-19 ENCOUNTER — Ambulatory Visit (AMBULATORY_SURGERY_CENTER): Payer: 59 | Admitting: Gastroenterology

## 2014-08-19 VITALS — BP 151/99 | HR 65 | Temp 98.1°F | Resp 16 | Ht 71.5 in | Wt 269.0 lb

## 2014-08-19 DIAGNOSIS — D122 Benign neoplasm of ascending colon: Secondary | ICD-10-CM

## 2014-08-19 DIAGNOSIS — D125 Benign neoplasm of sigmoid colon: Secondary | ICD-10-CM | POA: Diagnosis not present

## 2014-08-19 DIAGNOSIS — D123 Benign neoplasm of transverse colon: Secondary | ICD-10-CM

## 2014-08-19 DIAGNOSIS — Z86010 Personal history of colonic polyps: Secondary | ICD-10-CM | POA: Diagnosis present

## 2014-08-19 MED ORDER — SODIUM CHLORIDE 0.9 % IV SOLN: 500.0000 mL | INTRAVENOUS | Status: DC

## 2014-08-19 NOTE — Progress Notes (Signed)
Called to room to assist during endoscopic procedure.  Patient ID and intended procedure confirmed with present staff. Received instructions for my participation in the procedure from the performing physician.  

## 2014-08-19 NOTE — Op Note (Signed)
[place]  [address] [place], [id number]   COLONOSCOPY PROCEDURE REPORT  PATIENT: [name], [name]  MR#: [id number] BIRTHDATE: 08/15/1960 , 55  yrs. old GENDER: male ENDOSCOPIST: Ladene Artist, MD, [facility] PROCEDURE DATE:  08/19/2014 PROCEDURE:   Colonoscopy, surveillance , Colonoscopy with biopsy, and Colonoscopy with snare polypectomy First Screening Colonoscopy - Avg.  risk and is 50 yrs.  old or older - No.  Prior Negative Screening - Now for repeat screening. N/A  History of Adenoma - Now for follow-up colonoscopy & has been > or = to 3 yrs.  Yes hx of adenoma.  Has been 3 or more years since last colonoscopy. ASA CLASS:   Class III INDICATIONS:Surveillance due to prior colonic neoplasia and PH Colon Adenoma. MEDICATIONS: Monitored anesthesia care and Propofol 400 mg IV DESCRIPTION OF PROCEDURE:   After the risks benefits and alternatives of the procedure were thoroughly explained, informed consent was obtained.  The digital rectal exam revealed no abnormalities of the rectum.   The LB CF-H180AL Loaner [id number] endoscope was introduced through the anus and advanced to the cecum, which was identified by both the appendix and ileocecal valve. No adverse events experienced.   The quality of the prep was excellent.  (MoviPrep was used)  The instrument was then slowly withdrawn as the colon was fully examined.    COLON FINDINGS: Two sessile polyps measuring 6 mm in size were found in the transverse colon and ascending colon.  A polypectomy was performed with a cold snare.  The resection was complete, the polyp tissue was completely retrieved and sent to histology.   Three sessile polyps measuring 4-5 mm in size were found in the sigmoid colon and transverse colon.  A polypectomy was performed with cold forceps.  The resection was complete, the polyp tissue was completely retrieved and sent to histology.   A pedunculated polyp measuring 8 mm in size was  found in the sigmoid colon.  A polypectomy was performed using snare cautery.  The resection was complete, the polyp tissue was completely retrieved and sent to histology.   There was mild diverticulosis noted in the descending colon.   The examination was otherwise normal.  Retroflexed views revealed internal Grade I hemorrhoids. The time to cecum = 3.3 Withdrawal time = 15.1   The scope was withdrawn and the procedure completed.  COMPLICATIONS: There were no immediate complications.   ENDOSCOPIC IMPRESSION: 1.   Two sessile polyps in the transverse colon and ascending colon; polypectomy performed with a cold snare 2.   Three sessile polyps in the sigmoid colon and transverse colon; polypectomy performed with cold forceps 3.   Pedunculated polyp in the sigmoid colon; polypectomy performed using snare cautery 4.   Mild diverticulosis in the descending colon 5.   Grade l internal hemorrhoids  RECOMMENDATIONS: 1.  Hold Aspirin and all other NSAIDS for 2 weeks. 2.  Await pathology results 3.  Repeat Colonoscopy in 3 years. 4.  Resume Eliquis in 2 days  eSigned:  Ladene Artist, MD, [facility] 08/19/2014 10:59 AM   cc: Crist Infante, MD   PATIENT NAME:  Norman Sims, Norman Sims MR#: [id number]

## 2014-08-19 NOTE — Progress Notes (Signed)
Report to PACU, RN, vss, BBS= Clear.  

## 2014-08-19 NOTE — Patient Instructions (Addendum)
YOU HAD AN ENDOSCOPIC PROCEDURE TODAY AT [facility]:   Refer to the procedure report that was given to you for any specific questions about what was found during the examination.  If the procedure report does not answer your questions, please call your gastroenterologist to clarify.  If you requested that your care partner not be given the details of your procedure findings, then the procedure report has been included in a sealed envelope for you to review at your convenience later.  YOU SHOULD EXPECT: Some feelings of bloating in the abdomen. Passage of more gas than usual.  Walking can help get rid of the air that was put into your GI tract during the procedure and reduce the bloating. If you had a lower endoscopy (such as a colonoscopy or flexible sigmoidoscopy) you may notice spotting of blood in your stool or on the toilet paper. If you underwent a bowel prep for your procedure, you may not have a normal bowel movement for a few days.  Please Note:  You might notice some irritation and congestion in your nose or some drainage.  This is from the oxygen used during your procedure.  There is no need for concern and it should clear up in a day or so.  SYMPTOMS TO REPORT IMMEDIATELY:   Following lower endoscopy (colonoscopy or flexible sigmoidoscopy):  Excessive amounts of blood in the stool  Significant tenderness or worsening of abdominal pains  Swelling of the abdomen that is new, acute  Fever of 100F or higher   For urgent or emergent issues, a gastroenterologist can be reached at any hour by calling [phone].   DIET: Your first meal following the procedure should be a small meal and then it is ok to progress to your normal diet. Heavy or fried foods are harder to digest and may make you feel nauseous or bloated.  Likewise, meals heavy in dairy and vegetables can increase bloating.  Drink plenty of fluids but you should avoid alcoholic beverages for 24  hours.  ACTIVITY:  You should plan to take it easy for the rest of today and you should NOT DRIVE or use heavy machinery until tomorrow (because of the sedation medicines used during the test).    FOLLOW UP: Our staff will call the number listed on your records the next business day following your procedure to check on you and address any questions or concerns that you may have regarding the information given to you following your procedure. If we do not reach you, we will leave a message.  However, if you are feeling well and you are not experiencing any problems, there is no need to return our call.  We will assume that you have returned to your regular daily activities without incident.  If any biopsies were taken you will be contacted by phone or by letter within the next 1-3 weeks.  Please call us at [phone] if you have not heard about the biopsies in 3 weeks.    SIGNATURES/CONFIDENTIALITY: You and/or your care partner have signed paperwork which will be entered into your electronic medical record.  These signatures attest to the fact that that the information above on your After Visit Summary has been reviewed and is understood.  Full responsibility of the confidentiality of this discharge information lies with you and/or your care-partner.  Recommendations Discharge instructions given to patient and/or care partner. Next colonoscopy in 3 years. Resume Eliquis in 2 days. Polyp, diverticulosis, high fiber diet,  and hemorrhoid handouts provided. Hold all aspirin, aspirin products, and NSAID's for 2 weeks.

## 2014-08-22 ENCOUNTER — Telehealth: Payer: Self-pay

## 2014-08-22 NOTE — Telephone Encounter (Signed)
  Follow up Call-  Call back number 08/19/2014  Post procedure Call Back phone  # [phone]  Permission to leave phone message Yes     Patient questions:  Do you have a fever, pain , or abdominal swelling? No. Pain Score  0 *  Have you tolerated food without any problems? Yes.    Have you been able to return to your normal activities? Yes.    Do you have any questions about your discharge instructions: Diet   No. Medications  No. Follow up visit  No.  Do you have questions or concerns about your Care? No.  Actions: * If pain score is 4 or above: No action needed, pain <4.

## 2014-08-27 ENCOUNTER — Encounter: Payer: Self-pay | Admitting: Gastroenterology

## 2014-09-05 ENCOUNTER — Other Ambulatory Visit: Payer: Self-pay

## 2014-09-05 DIAGNOSIS — I10 Essential (primary) hypertension: Secondary | ICD-10-CM

## 2014-09-05 MED ORDER — LOSARTAN POTASSIUM 50 MG PO TABS: 50.0000 mg | ORAL_TABLET | Freq: Every day | ORAL | Status: DC

## 2014-09-19 ENCOUNTER — Other Ambulatory Visit: Payer: Self-pay

## 2014-09-19 DIAGNOSIS — I4891 Unspecified atrial fibrillation: Secondary | ICD-10-CM

## 2014-09-19 MED ORDER — METOPROLOL SUCCINATE ER 50 MG PO TB24: 50.0000 mg | ORAL_TABLET | Freq: Every day | ORAL | Status: DC

## 2014-09-19 NOTE — Telephone Encounter (Signed)
Lelon Perla, MD at 05/27/2014 9:09 AM  metoprolol succinate (TOPROL XL) 50 MG 24 hr tablet Take 1 tablet (50 mg total) by mouth daily Atrial fibrillation with RVR - Lelon Perla, MD at 05/30/2014 3:54 PM     Status: Written Related Problem: Atrial fibrillation with RVR   Expand All Collapse All   Patient has converted to sinus rhythm. He feels markedly improved. We will therefore try and maintain sinus rhythm. Continue flecainide. Continue Cardizem and beta blocker. If he has more frequent episodes in the future we can consider a different antiarrhythmic versus referral for ablation. His embolic risk factors include hypertension. I will continue apixaban

## 2014-12-05 NOTE — Progress Notes (Signed)
HPI: FU atrial fibrillation. [facility]2013 with SOB and cough. He was found to have rapid atrial fibrillation and was sent to [facility]. He was admitted and started on IV diltiazem for rate control. Within 24 hours he spontaneously converted to SR. He was started on Eliquis and diltiazem. An echo was done revealing normal LV function and no significant valvular abnormality; mild biatrial enlargement. His cough was felt to be related to ACEI; therefore, this was discontinued. He was instructed to decrease his alcohol intake. Nuclear study in February of 2015 showed normal perfusion. Afib recurred; flecanide and toprol added. Patient had DCCV 3/15 but did not hold sinus. Flecanide increased to 100 BID. Severe OSA noted and CPAP recommended prior to repeating DCCV. Exercise treadmill on 08/03/2013 showed no exercise induced ventricular arrhythmias. Repeat DCCV 6/15 to sinus. Also with h/o flutter. Since last seen,   Current Outpatient Prescriptions  Medication Sig Dispense Refill  . atorvastatin (LIPITOR) 20 MG tablet Take 20 mg by mouth every morning.     . cholecalciferol (VITAMIN D) 1000 UNITS tablet Take 1,000 Units by mouth every morning.    . diltiazem (CARDIZEM CD) 360 MG 24 hr capsule Take 1 capsule (360 mg total) by mouth daily. 30 capsule 5  . ELIQUIS 5 MG TABS tablet TAKE 1 TABLET BY MOUTH TWICE DAILY 60 tablet 11  . fish oil-omega-3 fatty acids 1000 MG capsule Take 3,000 mg by mouth every morning.     . flecainide (TAMBOCOR) 50 MG tablet TAKE 2 TABLETS TWICE DAILY 180 tablet 3  . losartan (COZAAR) 50 MG tablet Take 1 tablet (50 mg total) by mouth daily. 90 tablet 3  . metoprolol succinate (TOPROL-XL) 50 MG 24 hr tablet Take 1 tablet (50 mg total) by mouth daily. 90 tablet 2  . Omeprazole-Sodium Bicarbonate (ZEGERID) 20-1100 MG CAPS capsule Take 1 capsule by mouth daily.     . valACYclovir (VALTREX) 500 MG tablet Take 500 mg by mouth daily as needed (for an  occurance).      No current facility-administered medications for this visit.     Past Medical History  Diagnosis Date  . Hypertension 2008  . Hyperlipidemia   . Depression   . GERD (gastroesophageal reflux disease)   . Atrial fibrillation   . Colon polyp   . Sleep apnea     wears cpap  . Chronic kidney disease     past hx kidney stones    Past Surgical History  Procedure Laterality Date  . Appendectomy    . Cardioversion N/A 07/29/2013    Procedure: CARDIOVERSION;  Surgeon: Sueanne Margarita, MD;  Location: [place];  Service: Cardiovascular;  Laterality: N/A;  . Cardioversion N/A 11/01/2013    Procedure: CARDIOVERSION;  Surgeon: Sueanne Margarita, MD;  Location: [facility] ENDOSCOPY;  Service: Cardiovascular;  Laterality: N/A;  . Colonoscopy    . Polypectomy      History   Social History  . Marital Status: Married    Spouse Name: N/A  . Number of Children: 2  . 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 Smoking status: Former Smoker -- 3 years    Types: Cigars    Quit date: 09/07/2011  . Smokeless tobacco: Never Used  . Alcohol Use: 21.0 oz/week    7 Glasses of wine, 28 Cans of beer per week  Comment: 2 glasses of wine or mixed drink every 3 days  . Drug Use: No  . Sexual Activity: Not on file   Other Topics Concern  . Not on file   Social History Narrative    ROS: no fevers or chills, productive cough, hemoptysis, dysphasia, odynophagia, melena, hematochezia, dysuria, hematuria, rash, seizure activity, orthopnea, PND, pedal edema, claudication. Remaining systems are negative.  Physical Exam: Well-developed well-nourished in no acute distress.  Skin is warm and dry.  HEENT is normal.  Neck is supple.  Chest is clear to auscultation with normal expansion.  Cardiovascular exam is regular rate and rhythm.  Abdominal exam nontender or distended. No masses palpated. Extremities  show no edema. neuro grossly intact  ECG     This encounter was created in error - please disregard.

## 2014-12-06 ENCOUNTER — Encounter: Payer: 59 | Admitting: Cardiology

## 2015-01-03 ENCOUNTER — Other Ambulatory Visit: Payer: Self-pay | Admitting: *Deleted

## 2015-01-03 MED ORDER — DILTIAZEM HCL ER COATED BEADS 360 MG PO CP24: 360.0000 mg | ORAL_CAPSULE | Freq: Every day | ORAL | Status: DC

## 2015-01-03 NOTE — Progress Notes (Signed)
HPI: FU atrial fibrillation. [facility] with SOB and cough. He was found to have rapid atrial fibrillation and was sent to [facility]. He was admitted and started on IV diltiazem for rate control. Within 24 hours he spontaneously converted to SR. He was started on Eliquis and diltiazem. An echo was done revealing normal LV function and no significant valvular abnormality; mild biatrial enlargement. His cough was felt to be related to ACEI; therefore, this was discontinued. He was instructed to decrease his alcohol intake. Nuclear study in February of 2015 showed normal perfusion. Afib recurred; flecanide and toprol added. Patient had DCCV 3/15 but did not hold sinus. Flecanide increased to 100 BID. Severe OSA noted and CPAP recommended prior to repeating DCCV. Exercise treadmill on 08/03/2013 showed no exercise induced ventricular arrhythmias. Repeat DCCV 6/15 to sinus. Since last seen, the patient denies any dyspnea on exertion, orthopnea, PND, pedal edema, palpitations, syncope or chest pain.   Current Outpatient Prescriptions  Medication Sig Dispense Refill  . atorvastatin (LIPITOR) 80 MG tablet Take 1 tablet by mouth daily.    . cholecalciferol (VITAMIN D) 1000 UNITS tablet Take 1,000 Units by mouth every morning.    . diltiazem (CARDIZEM CD) 360 MG 24 hr capsule Take 1 capsule (360 mg total) by mouth daily. 30 capsule 11  . ELIQUIS 5 MG TABS tablet TAKE 1 TABLET BY MOUTH TWICE DAILY 60 tablet 11  . fish oil-omega-3 fatty acids 1000 MG capsule Take 3,000 mg by mouth every morning.     . flecainide (TAMBOCOR) 50 MG tablet TAKE 2 TABLETS TWICE DAILY 180 tablet 3  . losartan (COZAAR) 50 MG tablet Take 1 tablet (50 mg total) by mouth daily. 90 tablet 3  . metoprolol succinate (TOPROL-XL) 50 MG 24 hr tablet Take 1 tablet (50 mg total) by mouth daily. 90 tablet 2  . Omeprazole-Sodium Bicarbonate (ZEGERID) 20-1100 MG CAPS capsule Take 1 capsule by mouth daily.     .  QUEtiapine (SEROQUEL) 100 MG tablet Take 2 tablets by mouth at bedtime.    . sertraline (ZOLOFT) 100 MG tablet Take 1.5 tablets by mouth daily.    Marland Kitchen triamcinolone cream (KENALOG) 0.1 % Apply 1 application topically daily as needed.    . valACYclovir (VALTREX) 500 MG tablet Take 500 mg by mouth daily as needed (for an occurance).      No current facility-administered medications for this visit.     Past Medical History  Diagnosis Date  . Hypertension 2008  . Hyperlipidemia   . Depression   . GERD (gastroesophageal reflux disease)   . Atrial fibrillation   . Colon polyp   . Sleep apnea     wears cpap  . Chronic kidney disease     past hx kidney stones    Past Surgical History  Procedure Laterality Date  . Appendectomy    . Cardioversion N/A 07/29/2013    Procedure: CARDIOVERSION;  Surgeon: Sueanne Margarita, MD;  Location: [place];  Service: Cardiovascular;  Laterality: N/A;  . Cardioversion N/A 11/01/2013    Procedure: CARDIOVERSION;  Surgeon: Sueanne Margarita, MD;  Location: [facility] ENDOSCOPY;  Service: Cardiovascular;  Laterality: N/A;  . Colonoscopy    . Polypectomy      Social History   Social History  . Marital Status: Married    Spouse Name: N/A  . Number of Children: 2  . Years of Education: N/A   Occupational History  . General Contractor  General Contractor    Social History Main Topics  . Smoking status: Former Smoker -- 3 years    Types: Cigars    Quit date: 09/07/2011  . Smokeless tobacco: Never Used  . Alcohol Use: 21.0 oz/week    7 Glasses of wine, 28 Cans of beer per week     Comment: 2 glasses of wine or mixed drink every 3 days  . Drug Use: No  . Sexual Activity: Not on file   Other Topics Concern  . Not on file   Social History Narrative    ROS: no fevers or chills, productive cough, hemoptysis, dysphasia, odynophagia, melena, hematochezia, dysuria, hematuria, rash, seizure activity, orthopnea, PND, pedal edema, claudication. Remaining  systems are negative.  Physical Exam: Well-developed well-nourished in no acute distress.  Skin is warm and dry.  HEENT is normal.  Neck is supple.  Chest is clear to auscultation with normal expansion.  Cardiovascular exam is regular rate and rhythm.  Abdominal exam nontender or distended. No masses palpated. Extremities show no edema. neuro grossly intact  ECG sinus rhythm at a rate of 59. No ST changes.

## 2015-01-06 ENCOUNTER — Ambulatory Visit (INDEPENDENT_AMBULATORY_CARE_PROVIDER_SITE_OTHER): Payer: 59 | Admitting: Cardiology

## 2015-01-06 ENCOUNTER — Encounter: Payer: Self-pay | Admitting: Cardiology

## 2015-01-06 VITALS — BP 142/82 | HR 59 | Ht 71.5 in | Wt 279.9 lb

## 2015-01-06 DIAGNOSIS — I4891 Unspecified atrial fibrillation: Secondary | ICD-10-CM | POA: Diagnosis not present

## 2015-01-06 DIAGNOSIS — E785 Hyperlipidemia, unspecified: Secondary | ICD-10-CM | POA: Diagnosis not present

## 2015-01-06 DIAGNOSIS — I10 Essential (primary) hypertension: Secondary | ICD-10-CM

## 2015-01-06 MED ORDER — LOSARTAN POTASSIUM 100 MG PO TABS: 100.0000 mg | ORAL_TABLET | Freq: Every day | ORAL | Status: DC

## 2015-01-06 NOTE — Assessment & Plan Note (Addendum)
Continue statin. Check lipids and liver. 

## 2015-01-06 NOTE — Assessment & Plan Note (Signed)
Blood pressure mildly elevated. Increase Cozaar to 100 mg daily. Check potassium and renal function in 1 week.

## 2015-01-06 NOTE — Patient Instructions (Signed)
Your physician wants you to follow-up in: [place] will receive a reminder letter in the mail two months in advance. If you don't receive a letter, please call our office to schedule the follow-up appointment.   INCREASE LOSARTAN TO 100 MG ONCE DAILY= 2 OF THE 50 MG TABLETS ONCE DAILY  Your physician recommends that you return for lab work ONE WEEK=DO NOT EAT PRIOR TO LAB WORK

## 2015-01-06 NOTE — Assessment & Plan Note (Addendum)
Patient remains in sinus rhythm. Continue flecainide. Continue Cardizem and beta blocker. If he has more frequent episodes in the future we can consider a different antiarrhythmic versus referral for ablation. His embolic risk factors include hypertension. I will continue apixaban. Check hemoglobin and renal function now and every six months.

## 2015-01-24 ENCOUNTER — Other Ambulatory Visit: Payer: Self-pay | Admitting: Cardiology

## 2015-02-14 IMAGING — CR DG CHEST 1V PORT
2 series · 2 of 2 positions shown · non-contrast
Comparison: 08/28/2018

CLINICAL DATA: Hypertension and chest pain

EXAM:
PORTABLE CHEST - 1 VIEW

[AP (1 of 2)]
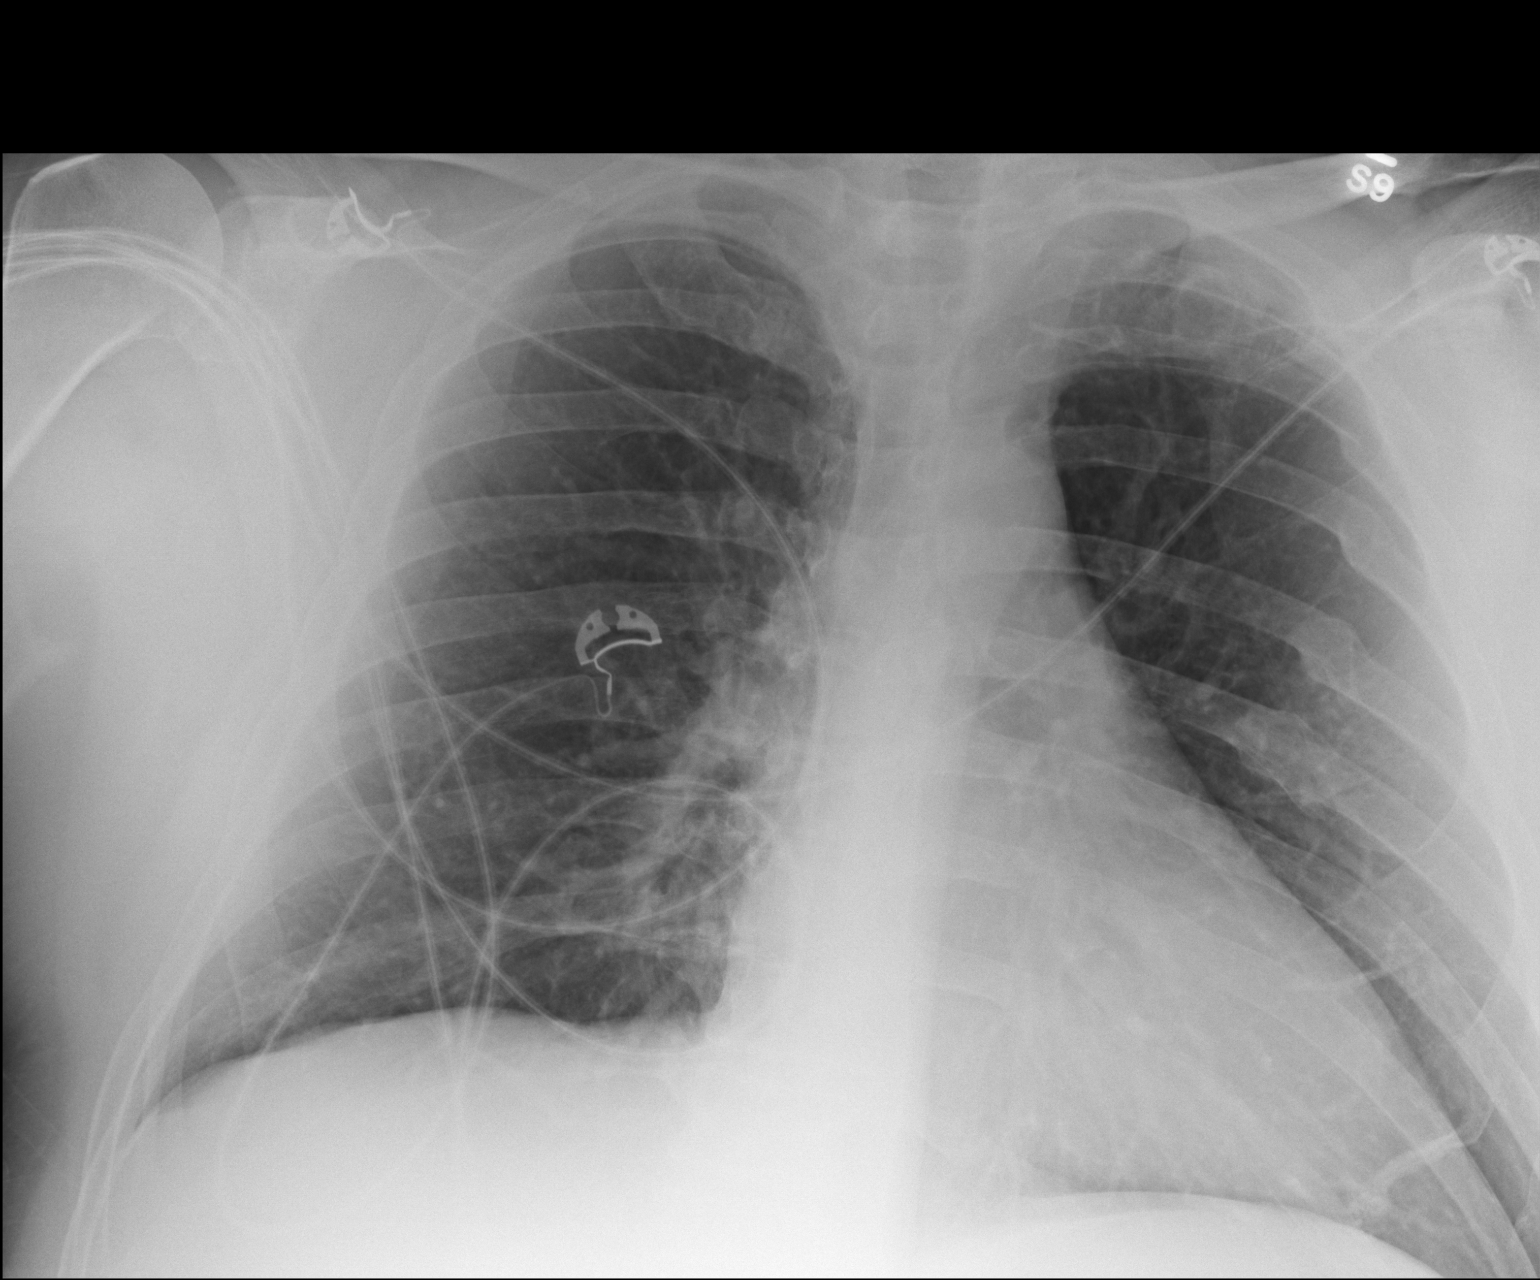

[AP (2 of 2)]
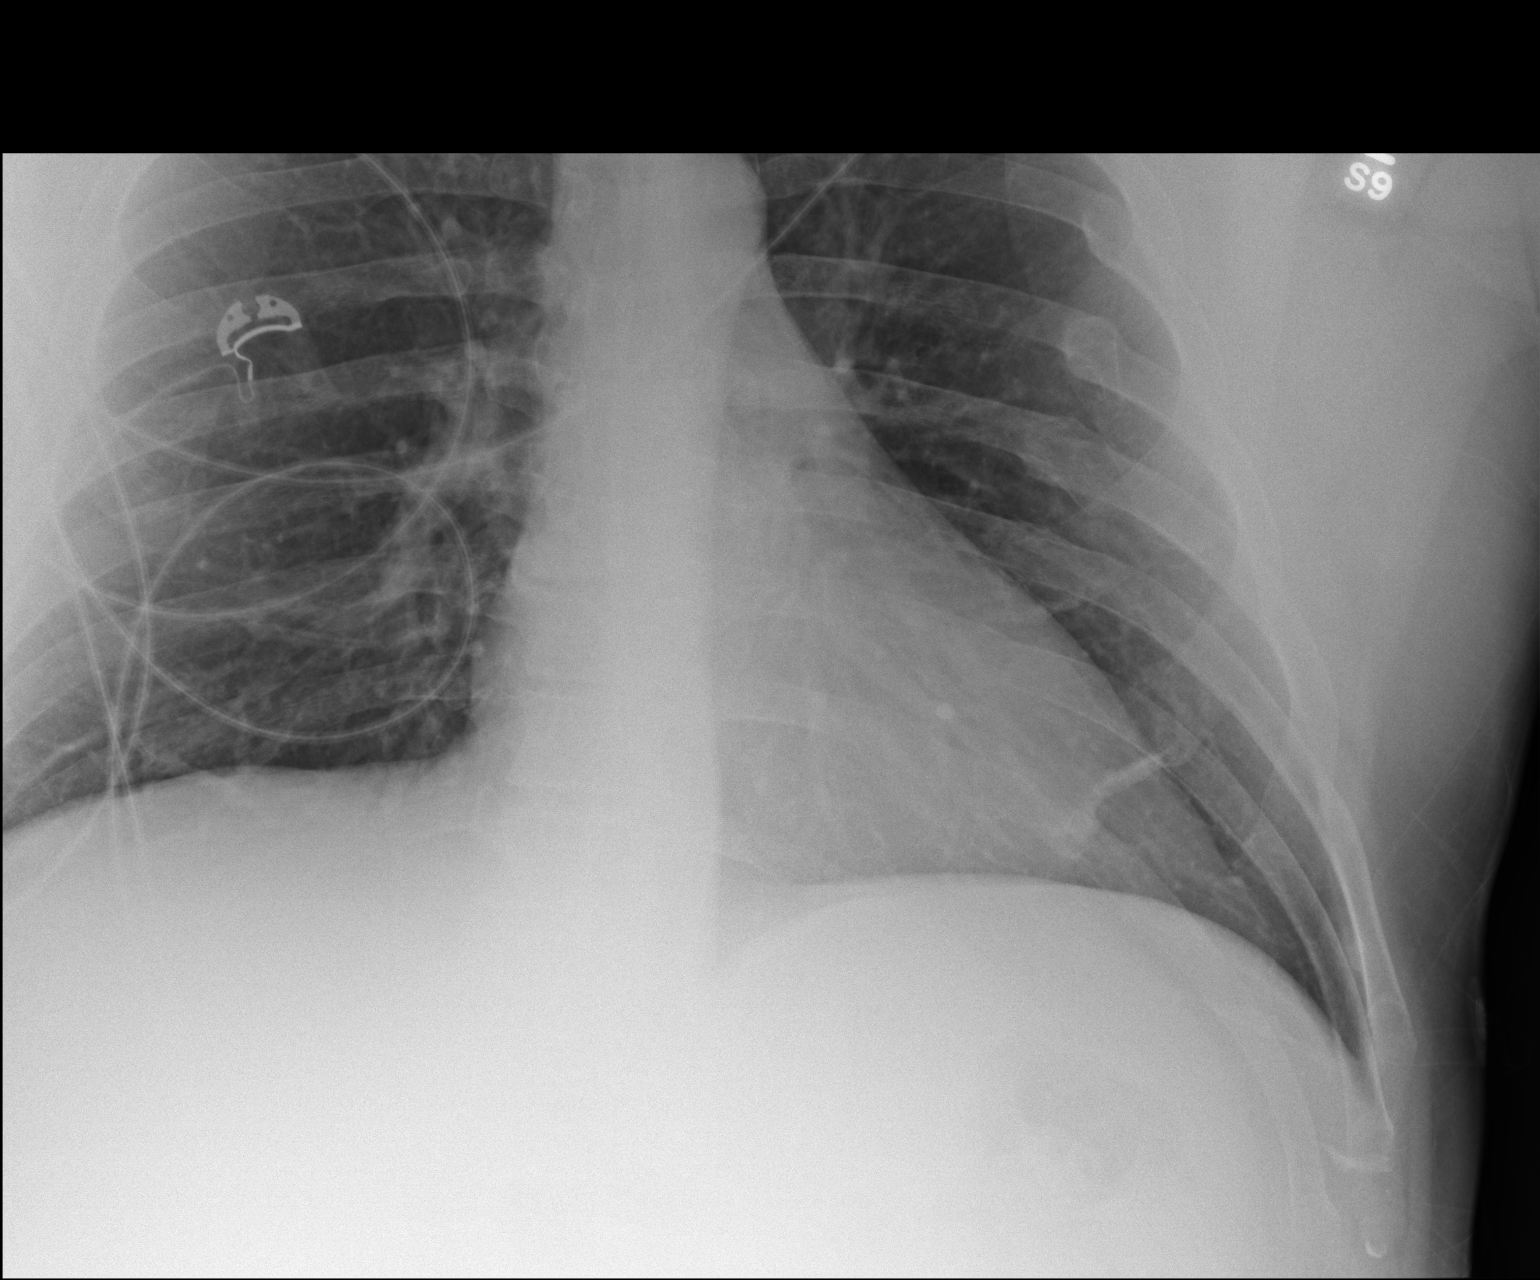

[2 of 2 positions shown; findings below may reference images not displayed]

FINDINGS: Cardiac shadow is stable. Multiple old rib fractures are noted on
the left with healing. No focal infiltrate, effusion or pneumothorax
is noted.
IMPRESSION: No acute abnormality seen.

## 2015-03-28 ENCOUNTER — Encounter: Payer: Self-pay | Admitting: *Deleted

## 2015-04-03 ENCOUNTER — Other Ambulatory Visit: Payer: Self-pay

## 2015-04-03 NOTE — Telephone Encounter (Signed)
No, will need to get from PCP.

## 2015-05-30 ENCOUNTER — Ambulatory Visit: Payer: Self-pay | Admitting: Cardiology

## 2015-05-31 NOTE — Progress Notes (Signed)
HPI: FU atrial fibrillation. [facility] with SOB and cough. He was found to have rapid atrial fibrillation and was sent to [facility]. He was admitted and started on IV diltiazem for rate control. Within 24 hours he spontaneously converted to SR. He was started on Eliquis and diltiazem. An echo was done revealing normal LV function and no significant valvular abnormality; mild biatrial enlargement. His cough was felt to be related to ACEI; therefore, this was discontinued. He was instructed to decrease his alcohol intake. Nuclear study in February of 2015 showed normal perfusion. Afib recurred; flecanide and toprol added. Patient had DCCV 3/15 but did not hold sinus. Flecanide increased to 100 BID. Severe OSA noted and CPAP recommended prior to repeating DCCV. Exercise treadmill on 08/03/2013 showed no exercise induced ventricular arrhythmias. Repeat DCCV 6/15 to sinus. Since last seen, He has noticed increased dyspnea on exertion. No orthopnea or PND. He has developed bilateral pedal edema. No chest pain, palpitations or syncope.  Current Outpatient Prescriptions  Medication Sig Dispense Refill  . atorvastatin (LIPITOR) 80 MG tablet Take 1 tablet by mouth daily.    . cholecalciferol (VITAMIN D) 1000 UNITS tablet Take 1,000 Units by mouth every morning.    . diltiazem (CARDIZEM CD) 360 MG 24 hr capsule Take 1 capsule (360 mg total) by mouth daily. 30 capsule 11  . fish oil-omega-3 fatty acids 1000 MG capsule Take 3,000 mg by mouth every morning.     . flecainide (TAMBOCOR) 50 MG tablet TAKE 2 TABLETS TWICE DAILY 120 tablet 10  . losartan (COZAAR) 100 MG tablet Take 1 tablet (100 mg total) by mouth daily. 90 tablet 3  . metoprolol succinate (TOPROL-XL) 50 MG 24 hr tablet Take 1 tablet (50 mg total) by mouth daily. 90 tablet 2  . Omeprazole-Sodium Bicarbonate (ZEGERID) 20-1100 MG CAPS capsule Take 1 capsule by mouth daily.     . QUEtiapine (SEROQUEL) 100 MG tablet  Take 2 tablets by mouth at bedtime.    . sertraline (ZOLOFT) 100 MG tablet Take 1.5 tablets by mouth daily.    . valACYclovir (VALTREX) 500 MG tablet Take 500 mg by mouth daily as needed (for an occurance).      No current facility-administered medications for this visit.     Past Medical History  Diagnosis Date  . Hypertension 2008  . Hyperlipidemia   . Depression   . GERD (gastroesophageal reflux disease)   . Atrial fibrillation ([place])   . Colon polyp   . Sleep apnea     wears cpap  . Chronic kidney disease     past hx kidney stones    Past Surgical History  Procedure Laterality Date  . Appendectomy    . Cardioversion N/A 07/29/2013    Procedure: CARDIOVERSION;  Surgeon: Sueanne Margarita, MD;  Location: [place];  Service: Cardiovascular;  Laterality: N/A;  . Cardioversion N/A 11/01/2013    Procedure: CARDIOVERSION;  Surgeon: Sueanne Margarita, MD;  Location: [facility] ENDOSCOPY;  Service: Cardiovascular;  Laterality: N/A;  . Colonoscopy    . Polypectomy      Social History   Social History  . Marital Status: Married    Spouse Name: N/A  . Number of Children: 2  . 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 Smoking status: Former Smoker -- 3 years    Types:  Cigars    Quit date: 09/07/2011  . Smokeless tobacco: Never Used  . Alcohol Use: 21.0 oz/week    7 Glasses of wine, 28 Cans of beer per week     Comment: 2 glasses of wine or mixed drink every 3 days  . Drug Use: No  . Sexual Activity: Not on file   Other Topics Concern  . Not on file   Social History Narrative    Family History  Problem Relation Age of Onset  . Pneumonia Mother   . CAD Father     a. CABG age 65.  . Allergies Father   . Hypertension Father   . Lung cancer Father   . CAD      Neg hx in siblings  . Allergies Sister   . Colon cancer Neg Hx   . Colon polyps Neg Hx   . Kidney disease Neg Hx   . Esophageal  cancer Neg Hx   . Rectal cancer Neg Hx   . Stomach cancer Neg Hx   . Diabetes Paternal Aunt   . Diabetes Paternal Uncle     ROS: Generalized fatigue but no fevers or chills, productive cough, hemoptysis, dysphasia, odynophagia, melena, hematochezia, dysuria, hematuria, rash, seizure activity, orthopnea, PND, claudication. Remaining systems are negative.  Physical Exam: Well-developed obese in no acute distress.  Skin is warm and dry.  HEENT is normal.  Neck is supple.  Chest is clear to auscultation with normal expansion.  Cardiovascular exam is regular rate and rhythm.  Abdominal exam nontender or distended. No masses palpated. Extremities show no trace to 1+ edema. neuro grossly intact  ECG Sinus rhythm with no ST changes.

## 2015-06-01 ENCOUNTER — Encounter: Payer: Self-pay | Admitting: Cardiology

## 2015-06-01 ENCOUNTER — Ambulatory Visit (INDEPENDENT_AMBULATORY_CARE_PROVIDER_SITE_OTHER): Payer: 59 | Admitting: Cardiology

## 2015-06-01 VITALS — BP 140/98 | HR 70 | Ht 71.0 in | Wt 300.0 lb

## 2015-06-01 DIAGNOSIS — E785 Hyperlipidemia, unspecified: Secondary | ICD-10-CM

## 2015-06-01 DIAGNOSIS — I4891 Unspecified atrial fibrillation: Secondary | ICD-10-CM | POA: Diagnosis not present

## 2015-06-01 DIAGNOSIS — I10 Essential (primary) hypertension: Secondary | ICD-10-CM

## 2015-06-01 DIAGNOSIS — I5031 Acute diastolic (congestive) heart failure: Secondary | ICD-10-CM | POA: Insufficient documentation

## 2015-06-01 DIAGNOSIS — I509 Heart failure, unspecified: Secondary | ICD-10-CM

## 2015-06-01 MED ORDER — FUROSEMIDE 20 MG PO TABS: 20.0000 mg | ORAL_TABLET | Freq: Every day | ORAL | Status: DC

## 2015-06-01 NOTE — Assessment & Plan Note (Signed)
Patient with increasing dyspnea on exertion and lower extremity edema. Add Lasix 20 mg daily. Check potassium and renal function in 1 week. Repeat echocardiogram.

## 2015-06-01 NOTE — Assessment & Plan Note (Addendum)
Patient remains in sinus rhythm. Continue flecainide. Continue Cardizem and beta blocker. If he has more frequent episodes in the future we can consider a different antiarrhythmic versus referral for ablation. His embolic risk factors include hypertension. He is not taking apixaban due to cost. We have provided the names of DOACs today and he will contact us if one is reasonably priced through his insurance. Check hemoglobin and renal function now. He is also describing fatigue. Check TSH.

## 2015-06-01 NOTE — Assessment & Plan Note (Signed)
Continue statin. Check lipids and liver. 

## 2015-06-01 NOTE — Patient Instructions (Addendum)
Medication Instructions:   START TAKING  LASIX 20 MG ONCE A DAY  STOP TAKING    If you need a refill on your cardiac medications before your next appointment, please call your pharmacy.  Labwork: BMET CBC TSH LIPID LIVER  IN ONE WEEK    Testing/Procedures: Your physician has requested that you have an echocardiogram. Echocardiography is a painless test that uses sound waves to create images of your heart. It provides your doctor with information about the size and shape of your heart and how well your heart's chambers and valves are working. This procedure takes approximately one hour. There are no restrictions for this procedure.   Follow-Up: IN 8 WEEKS WITH DR CRENSHAW    Any Other Special Instructions Will Be Listed Below (If Applicable).

## 2015-06-01 NOTE — Assessment & Plan Note (Addendum)
Blood pressure elevated. Add Lasix 20 mg daily. Follow blood pressure and adjust regimen as needed. Check potassium and renal function in 1 week.

## 2015-06-14 ENCOUNTER — Other Ambulatory Visit (HOSPITAL_COMMUNITY): Payer: 59

## 2015-06-19 ENCOUNTER — Other Ambulatory Visit (HOSPITAL_COMMUNITY): Payer: 59

## 2015-06-20 ENCOUNTER — Ambulatory Visit: Payer: 59 | Admitting: Pulmonary Disease

## 2015-06-23 ENCOUNTER — Ambulatory Visit (HOSPITAL_COMMUNITY): Payer: 59 | Attending: Internal Medicine

## 2015-06-23 DIAGNOSIS — I509 Heart failure, unspecified: Secondary | ICD-10-CM | POA: Insufficient documentation

## 2015-06-23 DIAGNOSIS — I351 Nonrheumatic aortic (valve) insufficiency: Secondary | ICD-10-CM | POA: Diagnosis not present

## 2015-06-23 DIAGNOSIS — Z6841 Body Mass Index (BMI) 40.0 and over, adult: Secondary | ICD-10-CM | POA: Insufficient documentation

## 2015-06-23 DIAGNOSIS — I4891 Unspecified atrial fibrillation: Secondary | ICD-10-CM | POA: Diagnosis not present

## 2015-06-23 DIAGNOSIS — I517 Cardiomegaly: Secondary | ICD-10-CM | POA: Insufficient documentation

## 2015-06-23 DIAGNOSIS — E785 Hyperlipidemia, unspecified: Secondary | ICD-10-CM | POA: Diagnosis not present

## 2015-06-23 DIAGNOSIS — I10 Essential (primary) hypertension: Secondary | ICD-10-CM | POA: Diagnosis not present

## 2015-06-23 DIAGNOSIS — E669 Obesity, unspecified: Secondary | ICD-10-CM | POA: Insufficient documentation

## 2015-07-13 ENCOUNTER — Telehealth: Payer: Self-pay | Admitting: Cardiology

## 2015-07-13 ENCOUNTER — Encounter: Payer: Self-pay | Admitting: Cardiology

## 2015-07-14 NOTE — Telephone Encounter (Signed)
Close encounter 

## 2015-07-19 ENCOUNTER — Ambulatory Visit: Payer: 59 | Admitting: Pulmonary Disease

## 2015-07-20 ENCOUNTER — Telehealth: Payer: Self-pay | Admitting: Cardiology

## 2015-07-20 DIAGNOSIS — I4891 Unspecified atrial fibrillation: Secondary | ICD-10-CM

## 2015-07-20 MED ORDER — METOPROLOL SUCCINATE ER 50 MG PO TB24: 50.0000 mg | ORAL_TABLET | Freq: Every day | ORAL | Status: AC

## 2015-07-20 NOTE — Telephone Encounter (Signed)
Refill sent, called pharmacy to notify and was not able to speak to representative after being placed on extended hold. Pt's preferred pharmacy not updated at last appt - [facility]

## 2015-07-20 NOTE — Telephone Encounter (Signed)
°*  STAT* If patient is at the pharmacy, call can be transferred to refill team.   1. Which medications need to be refilled? (please list name of each medication and dose if known)Metoprolol-please call asap-pt been without medicine 3 days   2. Which pharmacy/location (including street and city if local pharmacy) is medication to be sent to?[facility]-[phone]  3. Do they need a 30 day or 90 day supply? 90 and refiils

## 2015-07-28 ENCOUNTER — Ambulatory Visit: Payer: 59 | Admitting: Cardiology

## 2015-08-03 NOTE — Progress Notes (Signed)
HPI: FU atrial fibrillation. [facility] with SOB and cough. He was found to have rapid atrial fibrillation and was sent to [facility]. He was admitted and started on IV diltiazem for rate control. Within 24 hours he spontaneously converted to SR. He was started on Eliquis and diltiazem. His cough was felt to be related to ACEI; therefore, this was discontinued. He was instructed to decrease his alcohol intake. Nuclear study in February of 2015 showed normal perfusion. Afib recurred; flecanide and toprol added. Patient had DCCV 3/15 but did not hold sinus. Flecanide increased to 100 BID. Severe OSA noted and CPAP recommended prior to repeating DCCV. Exercise treadmill on 08/03/2013 showed no exercise induced ventricular arrhythmias. Repeat DCCV 6/15 to sinus. Repeat echo 2/17 showed normal LV function, mild LVH and mild LAE. Since last seen,   Current Outpatient Prescriptions  Medication Sig Dispense Refill  . atorvastatin (LIPITOR) 80 MG tablet Take 1 tablet by mouth daily.    . cholecalciferol (VITAMIN D) 1000 UNITS tablet Take 1,000 Units by mouth every morning.    . diltiazem (CARDIZEM CD) 360 MG 24 hr capsule Take 1 capsule (360 mg total) by mouth daily. 30 capsule 11  . fish oil-omega-3 fatty acids 1000 MG capsule Take 3,000 mg by mouth every morning.     . flecainide (TAMBOCOR) 50 MG tablet TAKE 2 TABLETS TWICE DAILY 120 tablet 10  . furosemide (LASIX) 20 MG tablet Take 1 tablet (20 mg total) by mouth daily. 30 tablet 6  . losartan (COZAAR) 100 MG tablet Take 1 tablet (100 mg total) by mouth daily. 90 tablet 3  . metoprolol succinate (TOPROL-XL) 50 MG 24 hr tablet Take 1 tablet (50 mg total) by mouth daily. 90 tablet 1  . Omeprazole-Sodium Bicarbonate (ZEGERID) 20-1100 MG CAPS capsule Take 1 capsule by mouth daily.     . QUEtiapine (SEROQUEL) 100 MG tablet Take 2 tablets by mouth at bedtime.    . sertraline (ZOLOFT) 100 MG tablet Take 1.5 tablets by mouth  daily.    . valACYclovir (VALTREX) 500 MG tablet Take 500 mg by mouth daily as needed (for an occurance).      No current facility-administered medications for this visit.     Past Medical History  Diagnosis Date  . Hypertension 2008  . Hyperlipidemia   . Depression   . GERD (gastroesophageal reflux disease)   . Atrial fibrillation ([place])   . Colon polyp   . Sleep apnea     wears cpap  . Chronic kidney disease     past hx kidney stones    Past Surgical History  Procedure Laterality Date  . Appendectomy    . Cardioversion N/A 07/29/2013    Procedure: CARDIOVERSION;  Surgeon: Sueanne Margarita, MD;  Location: [place];  Service: Cardiovascular;  Laterality: N/A;  . Cardioversion N/A 11/01/2013    Procedure: CARDIOVERSION;  Surgeon: Sueanne Margarita, MD;  Location: [facility] ENDOSCOPY;  Service: Cardiovascular;  Laterality: N/A;  . Colonoscopy    . Polypectomy      Social History   Social History  . Marital Status: Married    Spouse Name: N/A  . Number of Children: 2  . 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 Smoking status: Former Smoker -- 3 years    Types: Cigars    Quit date: 09/07/2011  .  Smokeless tobacco: Never Used  . Alcohol Use: 21.0 oz/week    7 Glasses of wine, 28 Cans of beer per week     Comment: 2 glasses of wine or mixed drink every 3 days  . Drug Use: No  . Sexual Activity: Not on file   Other Topics Concern  . Not on file   Social History Narrative    Family History  Problem Relation Age of Onset  . Pneumonia Mother   . CAD Father     a. CABG age 66.  . Allergies Father   . Hypertension Father   . Lung cancer Father   . CAD      Neg hx in siblings  . Allergies Sister   . Colon cancer Neg Hx   . Colon polyps Neg Hx   . Kidney disease Neg Hx   . Esophageal cancer Neg Hx   . Rectal cancer Neg Hx   . Stomach cancer Neg Hx   . Diabetes Paternal Aunt   .  Diabetes Paternal Uncle     ROS: no fevers or chills, productive cough, hemoptysis, dysphasia, odynophagia, melena, hematochezia, dysuria, hematuria, rash, seizure activity, orthopnea, PND, pedal edema, claudication. Remaining systems are negative.  Physical Exam: Well-developed well-nourished in no acute distress.  Skin is warm and dry.  HEENT is normal.  Neck is supple.  Chest is clear to auscultation with normal expansion.  Cardiovascular exam is regular rate and rhythm.  Abdominal exam nontender or distended. No masses palpated. Extremities show no edema. neuro grossly intact  ECG     This encounter was created in error - please disregard.

## 2015-08-07 LAB — CBC
HCT: 44.2 % (ref 39.0–52.0)
HEMOGLOBIN: 14.8 g/dL (ref 13.0–17.0)
MCH: 31.7 pg (ref 26.0–34.0)
MCHC: 33.5 g/dL (ref 30.0–36.0)
MCV: 94.6 fL (ref 78.0–100.0)
MPV: 10.6 fL (ref 8.6–12.4)
Platelets: 202 10*3/uL (ref 150–400)
RBC: 4.67 MIL/uL (ref 4.22–5.81)
RDW: 13.6 % (ref 11.5–15.5)
WBC: 5.8 10*3/uL (ref 4.0–10.5)

## 2015-08-08 ENCOUNTER — Encounter: Payer: 59 | Admitting: Cardiology

## 2015-08-08 LAB — BASIC METABOLIC PANEL
BUN: 14 mg/dL (ref 7–25)
CHLORIDE: 103 mmol/L (ref 98–110)
CO2: 27 mmol/L (ref 20–31)
Calcium: 9.3 mg/dL (ref 8.6–10.3)
Creat: 0.81 mg/dL (ref 0.70–1.33)
Glucose, Bld: 93 mg/dL (ref 65–99)
POTASSIUM: 4.1 mmol/L (ref 3.5–5.3)
SODIUM: 142 mmol/L (ref 135–146)

## 2015-08-08 LAB — HEPATIC FUNCTION PANEL
ALT: 43 U/L (ref 9–46)
AST: 31 U/L (ref 10–35)
Albumin: 4.2 g/dL (ref 3.6–5.1)
Alkaline Phosphatase: 85 U/L (ref 40–115)
BILIRUBIN INDIRECT: 0.4 mg/dL (ref 0.2–1.2)
Bilirubin, Direct: 0.1 mg/dL (ref <=0.2)
TOTAL PROTEIN: 6.4 g/dL (ref 6.1–8.1)
Total Bilirubin: 0.5 mg/dL (ref 0.2–1.2)

## 2015-08-08 LAB — LIPID PANEL
CHOL/HDL RATIO: 3.6 ratio (ref <=5.0)
CHOLESTEROL: 145 mg/dL (ref 125–200)
HDL: 40 mg/dL (ref >=40)
LDL Cholesterol: 74 mg/dL (ref <130)
TRIGLYCERIDES: 155 mg/dL — AB (ref <150)
VLDL: 31 mg/dL — AB (ref <30)

## 2015-08-08 LAB — TSH: TSH: 1.81 mIU/L (ref 0.40–4.50)

## 2015-08-09 ENCOUNTER — Encounter: Payer: Self-pay | Admitting: *Deleted

## 2015-08-09 ENCOUNTER — Telehealth: Payer: Self-pay | Admitting: Cardiology

## 2015-08-09 NOTE — Telephone Encounter (Signed)
Received records from [facility] for appointment on 09/04/15 with Dr Stanford Breed.  Records given to [facility] (medical records) for Dr Jacalyn Lefevre schedule on 09/04/15. lp

## 2015-09-01 NOTE — Progress Notes (Signed)
HPI: FU atrial fibrillation. [facility] with SOB and cough. He was found to have rapid atrial fibrillation and was sent to [facility]. He was admitted and started on IV diltiazem for rate control. Within 24 hours he spontaneously converted to SR. He was started on Eliquis and diltiazem. An echo was done revealing normal LV function and no significant valvular abnormality; mild biatrial enlargement. His cough was felt to be related to ACEI; therefore, this was discontinued. He was instructed to decrease his alcohol intake. Nuclear study in February of 2015 showed normal perfusion. Afib recurred; flecanide and toprol added. Patient had DCCV 3/15 but did not hold sinus. Flecanide increased to 100 BID. Severe OSA noted and CPAP recommended prior to repeating DCCV. Exercise treadmill on 08/03/2013 showed no exercise induced ventricular arrhythmias. Repeat DCCV 6/15 to sinus. Echocardiogram repeated February 2017. Normal LV function, mild left atrial enlargement and trace aortic insufficiency. Since last seen, He denies dyspnea, chest pain, palpitations or syncope. He does have problems with pedal edema worse towards the end of the day.  Current Outpatient Prescriptions  Medication Sig Dispense Refill  . atorvastatin (LIPITOR) 80 MG tablet Take 1 tablet by mouth daily.    . cholecalciferol (VITAMIN D) 1000 UNITS tablet Take 1,000 Units by mouth every morning.    . diltiazem (CARDIZEM CD) 360 MG 24 hr capsule Take 1 capsule (360 mg total) by mouth daily. 30 capsule 11  . fish oil-omega-3 fatty acids 1000 MG capsule Take 3,000 mg by mouth every morning.     . flecainide (TAMBOCOR) 50 MG tablet TAKE 2 TABLETS TWICE DAILY 120 tablet 10  . furosemide (LASIX) 20 MG tablet Take 1 tablet (20 mg total) by mouth daily. 30 tablet 6  . losartan (COZAAR) 100 MG tablet Take 1 tablet (100 mg total) by mouth daily. 90 tablet 3  . metoprolol succinate (TOPROL-XL) 50 MG 24 hr tablet Take 1  tablet (50 mg total) by mouth daily. 90 tablet 1  . Omeprazole-Sodium Bicarbonate (ZEGERID) 20-1100 MG CAPS capsule Take 1 capsule by mouth daily.     . QUEtiapine (SEROQUEL) 100 MG tablet Take 2 tablets by mouth at bedtime.    . sertraline (ZOLOFT) 100 MG tablet Take 1.5 tablets by mouth daily.    . valACYclovir (VALTREX) 500 MG tablet Take 500 mg by mouth daily as needed (for an occurance).      No current facility-administered medications for this visit.     Past Medical History  Diagnosis Date  . Hypertension 2008  . Hyperlipidemia   . Depression   . GERD (gastroesophageal reflux disease)   . Atrial fibrillation ([place])   . Colon polyp   . Sleep apnea     wears cpap  . Chronic kidney disease     past hx kidney stones    Past Surgical History  Procedure Laterality Date  . Appendectomy    . Cardioversion N/A 07/29/2013    Procedure: CARDIOVERSION;  Surgeon: Sueanne Margarita, MD;  Location: [place];  Service: Cardiovascular;  Laterality: N/A;  . Cardioversion N/A 11/01/2013    Procedure: CARDIOVERSION;  Surgeon: Sueanne Margarita, MD;  Location: [facility] ENDOSCOPY;  Service: Cardiovascular;  Laterality: N/A;  . Colonoscopy    . Polypectomy      Social History   Social History  . Marital Status: Married    Spouse Name: N/A  . Number of Children: 2  . 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 Smoking status: Former Smoker -- 3 years    Types: Cigars    Quit date: 09/07/2011  . Smokeless tobacco: Never Used  . Alcohol Use: 21.0 oz/week    7 Glasses of wine, 28 Cans of beer per week     Comment: 2 glasses of wine or mixed drink every 3 days  . Drug Use: No  . Sexual Activity: Not on file   Other Topics Concern  . Not on file   Social History Narrative    Family History  Problem Relation Age of Onset  . Pneumonia Mother   . CAD Father     a. CABG age 18.  . Allergies Father   .  Hypertension Father   . Lung cancer Father   . CAD      Neg hx in siblings  . Allergies Sister   . Colon cancer Neg Hx   . Colon polyps Neg Hx   . Kidney disease Neg Hx   . Esophageal cancer Neg Hx   . Rectal cancer Neg Hx   . Stomach cancer Neg Hx   . Diabetes Paternal Aunt   . Diabetes Paternal Uncle     ROS: no fevers or chills, productive cough, hemoptysis, dysphasia, odynophagia, melena, hematochezia, dysuria, hematuria, rash, seizure activity, orthopnea, PND, claudication. Remaining systems are negative.  Physical Exam: Well-developed obese in no acute distress.  Skin is warm and dry.  HEENT is normal.  Neck is supple.  Chest is clear to auscultation with normal expansion.  Cardiovascular exam is regular rate and rhythm.  Abdominal exam nontender or distended. No masses palpated. Extremities show trace edema. neuro grossly intact

## 2015-09-04 ENCOUNTER — Ambulatory Visit (INDEPENDENT_AMBULATORY_CARE_PROVIDER_SITE_OTHER): Payer: 59 | Admitting: Cardiology

## 2015-09-04 ENCOUNTER — Encounter: Payer: Self-pay | Admitting: Cardiology

## 2015-09-04 VITALS — BP 138/82 | HR 60 | Ht 71.5 in | Wt 300.6 lb

## 2015-09-04 DIAGNOSIS — I4891 Unspecified atrial fibrillation: Secondary | ICD-10-CM

## 2015-09-04 DIAGNOSIS — I5031 Acute diastolic (congestive) heart failure: Secondary | ICD-10-CM | POA: Diagnosis not present

## 2015-09-04 DIAGNOSIS — I10 Essential (primary) hypertension: Secondary | ICD-10-CM | POA: Diagnosis not present

## 2015-09-04 DIAGNOSIS — E785 Hyperlipidemia, unspecified: Secondary | ICD-10-CM | POA: Diagnosis not present

## 2015-09-04 NOTE — Assessment & Plan Note (Addendum)
Patient complains of increased lower extremity edema. He is not taking Lasix daily. Have asked him to take Lasix 20 mg daily and we will check potassium and renal function in 2 weeks. Continue compression hose. Keep feet elevated.

## 2015-09-04 NOTE — Patient Instructions (Signed)
Medication Instructions:   TAKE FUROSEMIDE DAILY  Labwork:  Your physician recommends that you return for lab work in: 2 WEEKS  Follow-Up:  Your physician wants you to follow-up in: [place] will receive a reminder letter in the mail two months in advance. If you don't receive a letter, please call our office to schedule the follow-up appointment.   XARELTO OR ELIQUIS OR SAVAYSA FOR BLOOD THINNERS

## 2015-09-04 NOTE — Assessment & Plan Note (Signed)
Blood pressure controlled. Continue present medications. 

## 2015-09-04 NOTE — Assessment & Plan Note (Addendum)
Patient remains in sinus rhythm on examination. Continue flecainide, cardizem and Toprol. CHADSvasc 1. He should be on anticoagulation long-term. He will check with his insurance company to see which DOAC is cheapest and we will begin.

## 2015-09-04 NOTE — Assessment & Plan Note (Signed)
Continue statin. 

## 2015-10-19 ENCOUNTER — Encounter: Payer: Self-pay | Admitting: *Deleted

## 2015-11-17 ENCOUNTER — Encounter: Payer: Self-pay | Admitting: Pulmonary Disease

## 2015-11-20 ENCOUNTER — Emergency Department (HOSPITAL_COMMUNITY)
Admission: EM | Admit: 2015-11-20 | Discharge: 2015-11-21 | Disposition: A | Discharge status: Home / Self Care | Payer: 59 | Attending: Emergency Medicine | Admitting: Emergency Medicine

## 2015-11-20 ENCOUNTER — Encounter (HOSPITAL_COMMUNITY): Payer: Self-pay | Admitting: Emergency Medicine

## 2015-11-20 DIAGNOSIS — F1010 Alcohol abuse, uncomplicated: Secondary | ICD-10-CM | POA: Insufficient documentation

## 2015-11-20 DIAGNOSIS — Z87891 Personal history of nicotine dependence: Secondary | ICD-10-CM | POA: Insufficient documentation

## 2015-11-20 DIAGNOSIS — F32A Depression, unspecified: Secondary | ICD-10-CM | POA: Diagnosis present

## 2015-11-20 DIAGNOSIS — F918 Other conduct disorders: Secondary | ICD-10-CM | POA: Insufficient documentation

## 2015-11-20 DIAGNOSIS — R4689 Other symptoms and signs involving appearance and behavior: Secondary | ICD-10-CM

## 2015-11-20 DIAGNOSIS — Z79899 Other long term (current) drug therapy: Secondary | ICD-10-CM | POA: Insufficient documentation

## 2015-11-20 DIAGNOSIS — R45851 Suicidal ideations: Secondary | ICD-10-CM

## 2015-11-20 DIAGNOSIS — Z7982 Long term (current) use of aspirin: Secondary | ICD-10-CM | POA: Insufficient documentation

## 2015-11-20 DIAGNOSIS — F329 Major depressive disorder, single episode, unspecified: Secondary | ICD-10-CM | POA: Insufficient documentation

## 2015-11-20 DIAGNOSIS — I4891 Unspecified atrial fibrillation: Secondary | ICD-10-CM | POA: Insufficient documentation

## 2015-11-20 DIAGNOSIS — F10129 Alcohol abuse with intoxication, unspecified: Secondary | ICD-10-CM

## 2015-11-20 DIAGNOSIS — E785 Hyperlipidemia, unspecified: Secondary | ICD-10-CM | POA: Insufficient documentation

## 2015-11-20 DIAGNOSIS — I129 Hypertensive chronic kidney disease with stage 1 through stage 4 chronic kidney disease, or unspecified chronic kidney disease: Secondary | ICD-10-CM | POA: Insufficient documentation

## 2015-11-20 DIAGNOSIS — N189 Chronic kidney disease, unspecified: Secondary | ICD-10-CM | POA: Insufficient documentation
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
## 2015-11-21 DIAGNOSIS — F10129 Alcohol abuse with intoxication, unspecified: Secondary | ICD-10-CM

## 2015-11-21 DIAGNOSIS — F329 Major depressive disorder, single episode, unspecified: Secondary | ICD-10-CM

## 2015-11-21 LAB — CBC WITH DIFFERENTIAL/PLATELET
BASOS PCT: 0 %
Basophils Absolute: 0 10*3/uL (ref 0.0–0.1)
EOS ABS: 0.1 10*3/uL (ref 0.0–0.7)
EOS PCT: 1 %
HCT: 45 % (ref 39.0–52.0)
Hemoglobin: 15.5 g/dL (ref 13.0–17.0)
LYMPHS ABS: 3.1 10*3/uL (ref 0.7–4.0)
Lymphocytes Relative: 32 %
MCH: 31.7 pg (ref 26.0–34.0)
MCHC: 34.4 g/dL (ref 30.0–36.0)
MCV: 92 fL (ref 78.0–100.0)
MONOS PCT: 8 %
Monocytes Absolute: 0.8 10*3/uL (ref 0.1–1.0)
Neutro Abs: 5.6 10*3/uL (ref 1.7–7.7)
Neutrophils Relative %: 59 %
PLATELETS: 173 10*3/uL (ref 150–400)
RBC: 4.89 MIL/uL (ref 4.22–5.81)
RDW: 14.4 % (ref 11.5–15.5)
WBC: 9.7 10*3/uL (ref 4.0–10.5)

## 2015-11-21 LAB — RAPID URINE DRUG SCREEN, HOSP PERFORMED
Amphetamines: NOT DETECTED
Barbiturates: NOT DETECTED
Benzodiazepines: POSITIVE — AB
Cocaine: NOT DETECTED
OPIATES: NOT DETECTED
Tetrahydrocannabinol: NOT DETECTED

## 2015-11-21 LAB — URINALYSIS, ROUTINE W REFLEX MICROSCOPIC
BILIRUBIN URINE: NEGATIVE
Glucose, UA: NEGATIVE mg/dL
KETONES UR: NEGATIVE mg/dL
Leukocytes, UA: NEGATIVE
NITRITE: NEGATIVE
Specific Gravity, Urine: 1.015 (ref 1.005–1.030)
pH: 6 (ref 5.0–8.0)

## 2015-11-21 LAB — COMPREHENSIVE METABOLIC PANEL
ALK PHOS: 87 U/L (ref 38–126)
ALT: 39 U/L (ref 17–63)
AST: 30 U/L (ref 15–41)
Albumin: 4.1 g/dL (ref 3.5–5.0)
Anion gap: 7 (ref 5–15)
BILIRUBIN TOTAL: 0.4 mg/dL (ref 0.3–1.2)
BUN: 14 mg/dL (ref 6–20)
CALCIUM: 9.1 mg/dL (ref 8.9–10.3)
CO2: 29 mmol/L (ref 22–32)
CREATININE: 0.97 mg/dL (ref 0.61–1.24)
Chloride: 106 mmol/L (ref 101–111)
GFR calc non Af Amer: >60 mL/min (ref >60)
Glucose, Bld: 107 mg/dL — ABNORMAL HIGH (ref 65–99)
Potassium: 3.6 mmol/L (ref 3.5–5.1)
SODIUM: 142 mmol/L (ref 135–145)
Total Protein: 7.4 g/dL (ref 6.5–8.1)

## 2015-11-21 LAB — URINE MICROSCOPIC-ADD ON: SQUAMOUS EPITHELIAL / LPF: NONE SEEN

## 2015-11-21 LAB — ETHANOL: Alcohol, Ethyl (B): 114 mg/dL — ABNORMAL HIGH (ref <5)

## 2015-11-21 MED ORDER — ATORVASTATIN CALCIUM 40 MG PO TABS
80.0000 mg | ORAL_TABLET | Freq: Every day | ORAL | Status: DC
  Filled 2015-11-21 (×2): qty 2

## 2015-11-21 MED ORDER — SERTRALINE HCL 50 MG PO TABS: 150.0000 mg | ORAL_TABLET | Freq: Every day | ORAL | Status: DC

## 2015-11-21 MED ORDER — VITAMIN B-1 100 MG PO TABS
100.0000 mg | ORAL_TABLET | Freq: Every day | ORAL | Status: DC
Start: 2015-11-21 — End: 2015-11-21

## 2015-11-21 MED ORDER — LORAZEPAM 1 MG PO TABS: 0.0000 mg | ORAL_TABLET | Freq: Four times a day (QID) | ORAL | Status: DC

## 2015-11-21 MED ORDER — PANTOPRAZOLE SODIUM 40 MG PO TBEC: 40.0000 mg | DELAYED_RELEASE_TABLET | Freq: Every day | ORAL | Status: DC

## 2015-11-21 MED ORDER — DILTIAZEM HCL ER COATED BEADS 180 MG PO CP24
360.0000 mg | ORAL_CAPSULE | Freq: Every day | ORAL | Status: DC
  Administered 2015-11-21: 360 mg via ORAL
  Filled 2015-11-21 (×2): qty 2

## 2015-11-21 MED ORDER — DILTIAZEM HCL ER COATED BEADS 360 MG PO CP24
360.0000 mg | ORAL_CAPSULE | Freq: Every day | ORAL | Status: DC
  Filled 2015-11-21 (×2): qty 1

## 2015-11-21 MED ORDER — ALUM & MAG HYDROXIDE-SIMETH 200-200-20 MG/5ML PO SUSP: 30.0000 mL | ORAL | Status: DC | PRN

## 2015-11-21 MED ORDER — QUETIAPINE FUMARATE 100 MG PO TABS
200.0000 mg | ORAL_TABLET | Freq: Every day | ORAL | Status: DC
  Filled 2015-11-21: qty 2

## 2015-11-21 MED ORDER — FUROSEMIDE 40 MG PO TABS: 20.0000 mg | ORAL_TABLET | Freq: Every day | ORAL | Status: DC

## 2015-11-21 MED ORDER — ZOLPIDEM TARTRATE 5 MG PO TABS: 10.0000 mg | ORAL_TABLET | Freq: Every evening | ORAL | Status: DC | PRN

## 2015-11-21 MED ORDER — ONDANSETRON HCL 4 MG PO TABS: 4.0000 mg | ORAL_TABLET | Freq: Three times a day (TID) | ORAL | Status: DC | PRN

## 2015-11-21 MED ORDER — VITAMIN D3 25 MCG (1000 UNIT) PO TABS
1000.0000 [IU] | ORAL_TABLET | Freq: Every morning | ORAL | Status: DC
  Filled 2015-11-21 (×2): qty 1

## 2015-11-21 MED ORDER — METOPROLOL SUCCINATE ER 50 MG PO TB24
50.0000 mg | ORAL_TABLET | Freq: Every day | ORAL | Status: DC
  Administered 2015-11-21: 50 mg via ORAL
  Filled 2015-11-21 (×2): qty 1

## 2015-11-21 MED ORDER — LOSARTAN POTASSIUM 50 MG PO TABS
100.0000 mg | ORAL_TABLET | Freq: Every day | ORAL | Status: DC
  Filled 2015-11-21 (×2): qty 2

## 2015-11-21 MED ORDER — THIAMINE HCL 100 MG/ML IJ SOLN: 100.0000 mg | Freq: Every day | INTRAMUSCULAR | Status: DC

## 2015-11-21 MED ORDER — NICOTINE 21 MG/24HR TD PT24: 21.0000 mg | MEDICATED_PATCH | Freq: Every day | TRANSDERMAL | Status: DC

## 2015-11-21 MED ORDER — IBUPROFEN 400 MG PO TABS: 600.0000 mg | ORAL_TABLET | Freq: Three times a day (TID) | ORAL | Status: DC | PRN

## 2015-11-21 MED ORDER — OMEGA-3-ACID ETHYL ESTERS 1 G PO CAPS
3000.0000 mg | ORAL_CAPSULE | Freq: Every morning | ORAL | Status: DC
  Filled 2015-11-21 (×2): qty 3

## 2015-11-21 MED ORDER — ACETAMINOPHEN 325 MG PO TABS: 650.0000 mg | ORAL_TABLET | ORAL | Status: DC | PRN

## 2015-11-21 MED ORDER — FLECAINIDE ACETATE 100 MG PO TABS
100.0000 mg | ORAL_TABLET | Freq: Two times a day (BID) | ORAL | Status: DC
  Filled 2015-11-21 (×3): qty 1

## 2015-11-21 MED ORDER — LORAZEPAM 1 MG PO TABS: 0.0000 mg | ORAL_TABLET | Freq: Two times a day (BID) | ORAL | Status: DC

## 2015-11-21 NOTE — Consult Note (Signed)
Telepsych Consultation   Reason for Consult: Depression, Aggressive behavior related to alcohol use Referring Physician:  EDP Patient Identification: Norman Sims MRN:  [id number] Principal Diagnosis: Depression Diagnosis:   Patient Active Problem List   Diagnosis Date Noted  . Alcohol abuse with intoxication ([place]) [F10.129] 11/21/2015  . Acute diastolic (congestive) heart failure ([place]) [I50.31] 06/01/2015  . History of colonic polyps [Z86.010] 03/21/2014  . Chronic anticoagulation [Z79.01] 03/21/2014  . OSA (obstructive sleep apnea) [G47.33] 09/06/2013  . Atrial fibrillation with RVR ([place]) [I48.91] 04/06/2013  . Cough [R05] 04/06/2013  . Hypertension [I10] 04/06/2013  . Hyperlipidemia [E78.5] 04/06/2013  . GERD (gastroesophageal reflux disease) [K21.9] 04/06/2013  . Depression [F32.9]     Total Time spent with patient: 30 minutes  Subjective:   Norman Sims is a 55 y.o. male patient admitted under IVC due to having a verbal altercation with his wife and son.   HPI:    Norman Sims is an 55 y.o. male who presents involuntarily. Patient reports that prior to arrival he, his son and wife engaged in a verbal altercation. Patient states he was upset because his wife and son moved his firearms. The patient reports not being on his psychiatric medication prescribed by Dr. Clovis Pu for about two weeks due to losing his health insurance. He describes his level of functioning as remaining intact "I still take care of myself." but has noted increased feelings of sadness. Patient states "Dr. Clovis Pu worked with me a year to get my medications right. I don't really remember what they are. But then I can not afford them now. I was borrowing a xanax from my wife to help me sleep. I am not a heavy drinker but like to have some beverage every day." The patient denies a history of withdrawal symptoms from alcohol use. He is not noted to have alcohol withdrawal symptoms during the  assessment and his CIWA score this morning was one. His blood pressure is elevated but the patient has a history of Hypertension.  Patient denies any suicidal thoughts. He denies that he ever considered shooting himself stating "I think my wife was just worried about my condition and made the house safe for me. I am not upset with her now. I think the drinking might have made it worse." Patient is unsure of when he will again have health insurance reporting he cancelled his most recent appointment with his Psychiatrist last month. He appears logical with future oriented thought process during assessment. Patient was receptive to being given resources for [place] as he works in [place] daily. Also advised patient to contact Dr. Casimiro Needle office to make them aware of his situation with his medications. Discussed with patient the dangers of using his wife's xanax and also drinking alcohol. He appears to minimize his use of alcohol but reports "When I'm on the medicine everything was fine even when I drank." Patient verbalized understanding of considering stopping his alcohol usage or significantly cutting back as it appears to have contributed to being placed under IVC by his family.  Patient appears stable for discharge with outpatient follow up. The social worker contacted his wife Earnest Bailey who did not identify any safety concerns about patient returning home. She has contacted his Psychiatrist Dr. Clovis Pu who is willing to help patient with obtaining his medications due to financial issues.   Past Psychiatric History: MDD  Risk to Self: Suicidal Ideation: No Suicidal Intent: No Is patient at risk for suicide?: No Suicidal Plan?: No Access  to Means: No What has been your use of drugs/alcohol within the last 12 months?: Pt reports alcohol conosumption 4-7 days a week Other Self Harm Risks: Non-compliant with medication Intentional Self Injurious Behavior: None Risk to Others: Homicidal Ideation: No Thoughts  of Harm to Others: No Current Homicidal Intent: No Current Homicidal Plan: No Access to Homicidal Means: No History of harm to others?: No Assessment of Violence: None Noted Violent Behavior Description: Pt denies any h/o violence Does patient have access to weapons?: Yes (Comment) (Pt owns several firearms) Criminal Charges Pending?: No Does patient have a court date: No Prior Inpatient Therapy: Prior Inpatient Therapy: No Prior Outpatient Therapy: Prior Outpatient Therapy: Yes Prior Therapy Dates: Ongoing Prior Therapy Facilty/Provider(s): [facility] Reason for Treatment: Depression Does patient have an ACCT team?: No Does patient have Intensive In-House Services?  : No Does patient have [facility] services? : No Does patient have P4CC services?: No  Past Medical History:  Past Medical History  Diagnosis Date  . Hypertension 2008  . Hyperlipidemia   . Depression   . GERD (gastroesophageal reflux disease)   . Atrial fibrillation ([place])   . Colon polyp   . Sleep apnea     wears cpap  . Chronic kidney disease     past hx kidney stones    Past Surgical History  Procedure Laterality Date  . Appendectomy    . Cardioversion N/A 07/29/2013    Procedure: CARDIOVERSION;  Surgeon: Sueanne Margarita, MD;  Location: [facility];  Service: Cardiovascular;  Laterality: N/A;  . Cardioversion N/A 11/01/2013    Procedure: CARDIOVERSION;  Surgeon: Sueanne Margarita, MD;  Location: [facility];  Service: Cardiovascular;  Laterality: N/A;  . Colonoscopy    . Polypectomy     Family History:  Family History  Problem Relation Age of Onset  . Pneumonia Mother   . CAD Father     a. CABG age 66.  . Allergies Father   . Hypertension Father   . Lung cancer Father   . CAD      Neg hx in siblings  . Allergies Sister   . Colon cancer Neg Hx   . Colon polyps Neg Hx   . Kidney disease Neg Hx   . Esophageal cancer Neg Hx   . Rectal cancer Neg Hx   . Stomach cancer Neg Hx   . Diabetes  Paternal Aunt   . Diabetes Paternal Uncle    Family Psychiatric  History: Denies Social History:  History  Alcohol Use  . 21.0 oz/week  . 7 Glasses of wine, 28 Cans of beer per week    Comment: 2 glasses of wine or mixed drink every 3 days     History  Drug Use No    Social History   Social History  . Marital Status: Married    Spouse Name: N/A  . Number of Children: 2  . 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 Smoking status: Former Smoker -- 3 years    Types: Cigars    Quit date: 09/07/2011  . Smokeless tobacco: Never Used  . Alcohol Use: 21.0 oz/week    7 Glasses of wine, 28 Cans of beer per week     Comment: 2 glasses of wine or mixed drink every 3 days  . Drug Use: No  . Sexual Activity: Not Asked   Other Topics Concern  .  None   Social History Narrative   Additional Social History:    Allergies:  No Known Allergies  Labs:  Results for orders placed or performed during the hospital encounter of 11/20/15 (from the past 48 hour(s))  Comprehensive metabolic panel     Status: Abnormal   Collection Time: 11/21/15 12:03 AM  Result Value Ref Range   Sodium 142 135 - 145 mmol/L   Potassium 3.6 3.5 - 5.1 mmol/L   Chloride 106 101 - 111 mmol/L   CO2 29 22 - 32 mmol/L   Glucose, Bld 107 (H) 65 - 99 mg/dL   BUN 14 6 - 20 mg/dL   Creatinine, Ser 0.97 0.61 - 1.24 mg/dL   Calcium 9.1 8.9 - 10.3 mg/dL   Total Protein 7.4 6.5 - 8.1 g/dL   Albumin 4.1 3.5 - 5.0 g/dL   AST 30 15 - 41 U/L   ALT 39 17 - 63 U/L   Alkaline Phosphatase 87 38 - 126 U/L   Total Bilirubin 0.4 0.3 - 1.2 mg/dL   GFR calc non Af Amer >60 >60 mL/min   GFR calc Af Amer >60 >60 mL/min    Comment: (NOTE) The eGFR has been calculated using the CKD EPI equation. This calculation has not been validated in all clinical situations. eGFR's persistently <60 mL/min signify possible Chronic Kidney Disease.     Anion gap 7 5 - 15  Ethanol     Status: Abnormal   Collection Time: 11/21/15 12:03 AM  Result Value Ref Range   Alcohol, Ethyl (B) 114 (H) <5 mg/dL    Comment:        LOWEST DETECTABLE LIMIT FOR SERUM ALCOHOL IS 5 mg/dL FOR MEDICAL PURPOSES ONLY   CBC with Differential     Status: None   Collection Time: 11/21/15 12:03 AM  Result Value Ref Range   WBC 9.7 4.0 - 10.5 K/uL   RBC 4.89 4.22 - 5.81 MIL/uL   Hemoglobin 15.5 13.0 - 17.0 g/dL   HCT 45.0 39.0 - 52.0 %   MCV 92.0 78.0 - 100.0 fL   MCH 31.7 26.0 - 34.0 pg   MCHC 34.4 30.0 - 36.0 g/dL   RDW 14.4 11.5 - 15.5 %   Platelets 173 150 - 400 K/uL   Neutrophils Relative % 59 %   Neutro Abs 5.6 1.7 - 7.7 K/uL   Lymphocytes Relative 32 %   Lymphs Abs 3.1 0.7 - 4.0 K/uL   Monocytes Relative 8 %   Monocytes Absolute 0.8 0.1 - 1.0 K/uL   Eosinophils Relative 1 %   Eosinophils Absolute 0.1 0.0 - 0.7 K/uL   Basophils Relative 0 %   Basophils Absolute 0.0 0.0 - 0.1 K/uL  Urinalysis, Routine w reflex microscopic     Status: Abnormal   Collection Time: 11/21/15 12:40 AM  Result Value Ref Range   Color, Urine YELLOW YELLOW   APPearance CLEAR CLEAR   Specific Gravity, Urine 1.015 1.005 - 1.030   pH 6.0 5.0 - 8.0   Glucose, UA NEGATIVE NEGATIVE mg/dL   Hgb urine dipstick TRACE (A) NEGATIVE   Bilirubin Urine NEGATIVE NEGATIVE   Ketones, ur NEGATIVE NEGATIVE mg/dL   Protein, ur TRACE (A) NEGATIVE mg/dL   Nitrite NEGATIVE NEGATIVE   Leukocytes, UA NEGATIVE NEGATIVE  Urine rapid drug screen (hosp performed)     Status: Abnormal   Collection Time: 11/21/15 12:40 AM  Result Value Ref Range   Opiates NONE DETECTED NONE DETECTED   Cocaine NONE  DETECTED NONE DETECTED   Benzodiazepines POSITIVE (A) NONE DETECTED   Amphetamines NONE DETECTED NONE DETECTED   Tetrahydrocannabinol NONE DETECTED NONE DETECTED   Barbiturates NONE DETECTED NONE DETECTED    Comment:        DRUG SCREEN FOR MEDICAL PURPOSES ONLY.  IF CONFIRMATION IS NEEDED FOR  ANY PURPOSE, NOTIFY LAB WITHIN 5 DAYS.        LOWEST DETECTABLE LIMITS FOR URINE DRUG SCREEN Drug Class       Cutoff (ng/mL) Amphetamine      1000 Barbiturate      200 Benzodiazepine   696 Tricyclics       295 Opiates          300 Cocaine          300 THC              50   Urine microscopic-add on     Status: Abnormal   Collection Time: 11/21/15 12:40 AM  Result Value Ref Range   Squamous Epithelial / LPF NONE SEEN NONE SEEN   WBC, UA 0-5 0 - 5 WBC/hpf   RBC / HPF 0-5 0 - 5 RBC/hpf   Bacteria, UA FEW (A) NONE SEEN   Urine-Other MUCOUS PRESENT     Current Facility-Administered Medications  Medication Dose Route Frequency Provider Last Rate Last Dose  . acetaminophen (TYLENOL) tablet 650 mg  650 mg Oral Q4H PRN Rolland Porter, MD      . alum & mag hydroxide-simeth (MAALOX/MYLANTA) 200-200-20 MG/5ML suspension 30 mL  30 mL Oral PRN Rolland Porter, MD      . atorvastatin (LIPITOR) tablet 80 mg  80 mg Oral Daily Rolland Porter, MD      . cholecalciferol (VITAMIN D) tablet 1,000 Units  1,000 Units Oral q morning - 10a Rolland Porter, MD      . diltiazem (CARDIZEM CD) 24 hr capsule 360 mg  360 mg Oral Daily Rolland Porter, MD      . diltiazem (CARDIZEM CD) 24 hr capsule 360 mg  360 mg Oral Daily Dorie Rank, MD      . flecainide [facility]) tablet 100 mg  100 mg Oral BID Rolland Porter, MD   100 mg at 11/21/15 0331  . furosemide (LASIX) tablet 20 mg  20 mg Oral Daily Rolland Porter, MD      . ibuprofen (ADVIL,MOTRIN) tablet 600 mg  600 mg Oral Q8H PRN Rolland Porter, MD      . LORazepam (ATIVAN) tablet 0-4 mg  0-4 mg Oral Q6H Rolland Porter, MD   0 mg at 11/21/15 0155   Followed by  . [START ON 11/23/2015] LORazepam (ATIVAN) tablet 0-4 mg  0-4 mg Oral Q12H Rolland Porter, MD      . losartan (COZAAR) tablet 100 mg  100 mg Oral Daily Rolland Porter, MD      . metoprolol succinate (TOPROL-XL) 24 hr tablet 50 mg  50 mg Oral Daily Rolland Porter, MD      . nicotine (NICODERM CQ - dosed in mg/24 hours) patch 21 mg  21 mg Transdermal Daily Rolland Porter, MD       . omega-3 acid ethyl esters (LOVAZA) capsule 3,000 mg  3,000 mg Oral q morning - 10a Iva Knapp, MD      . ondansetron (ZOFRAN) tablet 4 mg  4 mg Oral Q8H PRN Rolland Porter, MD      . pantoprazole (PROTONIX) EC tablet 40 mg  40 mg Oral Daily Rolland Porter, MD      .  QUEtiapine (SEROQUEL) tablet 200 mg  200 mg Oral QHS Rolland Porter, MD   200 mg at 11/21/15 0203  . sertraline (ZOLOFT) tablet 150 mg  150 mg Oral Daily Rolland Porter, MD      . thiamine (VITAMIN B-1) tablet 100 mg  100 mg Oral Daily Rolland Porter, MD       Or  . thiamine (B-1) injection 100 mg  100 mg Intravenous Daily Rolland Porter, MD      . zolpidem (AMBIEN) tablet 10 mg  10 mg Oral QHS PRN Rolland Porter, MD       Current Outpatient Prescriptions  Medication Sig Dispense Refill  . ARIPiprazole (ABILIFY) 2 MG tablet Take 2 mg by mouth daily.    Marland Kitchen aspirin 81 MG tablet Take 81 mg by mouth daily.    Marland Kitchen atorvastatin (LIPITOR) 80 MG tablet Take 1 tablet by mouth daily.    . cholecalciferol (VITAMIN D) 1000 UNITS tablet Take 1,000 Units by mouth every morning.    . fish oil-omega-3 fatty acids 1000 MG capsule Take 3,000 mg by mouth every morning.     . metoprolol succinate (TOPROL-XL) 50 MG 24 hr tablet Take 1 tablet (50 mg total) by mouth daily. 90 tablet 1  . Omeprazole-Sodium Bicarbonate (ZEGERID) 20-1100 MG CAPS capsule Take 1 capsule by mouth daily.     . paroxetine mesylate (PEXEVA) 20 MG tablet Take 30 mg by mouth every morning.    . traZODone (DESYREL) 100 MG tablet Take 100 mg by mouth at bedtime.    . valACYclovir (VALTREX) 500 MG tablet Take 500 mg by mouth daily as needed (for an occurance). Reported on 11/21/2015      Musculoskeletal:  Unable to assess via camera   Psychiatric Specialty Exam: Physical Exam  Review of Systems  Psychiatric/Behavioral: Positive for depression and substance abuse. Negative for suicidal ideas, hallucinations and memory loss. The patient has insomnia. The patient is not nervous/anxious.     Blood pressure  165/101, pulse 64, temperature 98.2 F (36.8 C), temperature source Oral, resp. rate 18, SpO2 99 %.There is no weight on file to calculate BMI.  General Appearance: Casual  Eye Contact:  Good  Speech:  Clear and Coherent  Volume:  Normal  Mood:  Euthymic  Affect:  Appropriate  Thought Process:  Coherent and Goal Directed  Orientation:  Full (Time, Place, and Person)  Thought Content:  WDL  Suicidal Thoughts:  No  Homicidal Thoughts:  No  Memory:  Immediate;   Good Recent;   Good Remote;   Good  Judgement:  Fair  Insight:  Present  Psychomotor Activity:  Normal  Concentration:  Concentration: Good and Attention Span: Good  Recall:  Good  Fund of Knowledge:  Good  Language:  Good  Akathisia:  No  Handed:  Right  AIMS (if indicated):     Assets:  Communication Skills Desire for [place] Talents/Skills  ADL's:  Intact  Cognition:  WNL  Sleep:        Treatment Plan Summary: Patient does not meet criteria for an inpatient admission at this time. He is willing to follow up outpatient in [place] with a Provider who accepts patients that do not have health insurance. Patient will be provided with resources upon his discharge. His wife has also contacted Dr. Clovis Pu to obtain assistance with medications.   Disposition: No evidence of imminent risk to self or others at present.   Patient does not meet  criteria for psychiatric inpatient admission. Supportive therapy provided about ongoing stressors. Discussed crisis plan, support from social network, calling 911, coming to the Emergency Department, and calling Suicide Hotline.  Elmarie Shiley, NP 11/21/2015 10:06 AM

## 2015-11-21 NOTE — ED Notes (Signed)
Pt sitting in chair, offered warm blanket & to turn lights off. Pt refused.

## 2015-11-21 NOTE — ED Notes (Signed)
Pt verbalizes he hasn't taken any of his medications. The only one he has been taking is metoprolol.

## 2015-11-21 NOTE — ED Notes (Signed)
TTS consult in process. 

## 2015-11-21 NOTE — ED Provider Notes (Signed)
Cleared by TTS and behavioral health for outpatient management. Social work had spoken with patient's wife who states that she did not feel that he would be unsafe with discharge home. He will have close follow-up with Dr. Casimiro Needle in 1 week and has outpatient resources for [facility] follow-up. He is denying SI/HI/AVH. Felt to be medically cleared for discharge for outpatient psych.  Forde Dandy, MD 11/21/15 1100

## 2015-11-21 NOTE — Progress Notes (Signed)
Provided listed options for outpatient mental health providers in pt's area. (Due to pt reporting to NP during tele-assessment that he recently lost insurance coverage and is in need of new provider). Gave information for [employer] and [facility] in pt's d/c summary instructions.   Sharren Bridge, MSW, LCSW Clinical Social Work, Disposition  11/21/2015 [phone]

## 2015-11-21 NOTE — ED Notes (Signed)
Pt standing in doorway says he wants his clothes & going to leave. [employer] called to send officer back to the hospital to inform him he will not be leaving tonight & will be reevaluated in the morning.

## 2015-11-21 NOTE — ED Notes (Signed)
Dr. Liu at bedside 
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
## 2015-11-21 NOTE — Discharge Instructions (Signed)
You are felt to be safe for discharge for follow-up with Dr. Carleene Overlie and at [facility] as needed.  Return for worsening symptoms, including feeling unsafe, recurrent thoughts of wanting to hurt yourself or others, or any other symptoms concerning to you.

## 2015-11-21 NOTE — ED Notes (Signed)
Pt given meal tray.

## 2015-11-21 NOTE — ED Notes (Signed)
[place] PD here explaining to pt why he can not leave. Pt demanding to call his wife. This nurse informed pt he can call his wife in the morning not in the middle of the night.
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
## 2015-11-21 NOTE — ED Notes (Signed)
Pharmacy technician calling patients pharmacy to verify medications at this time d/t pt states he is not taking most of the medications that are on his profile. [place] to re-telepsych pt this am per bridge call with [facility] this am.

## 2015-11-21 NOTE — ED Notes (Signed)
TTS at bedside. 

## 2015-11-21 NOTE — ED Notes (Signed)
TTS completed. 

## 2015-11-21 NOTE — ED Notes (Signed)
Pt states he has not been on seroquel in years. Advised pt our records indicated that was one of his home medicines. Pt states we need to call his Dr to see what he suppose to be on. Refuses to take medication
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
## 2015-11-29 ENCOUNTER — Telehealth: Payer: Self-pay | Admitting: *Deleted

## 2015-11-29 MED ORDER — DILTIAZEM HCL ER BEADS 360 MG PO CP24: 360.0000 mg | ORAL_CAPSULE | Freq: Every day | ORAL | Status: AC

## 2015-11-29 NOTE — Telephone Encounter (Signed)
Refill sent to the pharmacy electronically.  

## 2015-11-29 NOTE — Telephone Encounter (Signed)
Patient called he want to get a refill on the diltiazem 360 mg

## 2017-03-11 ENCOUNTER — Telehealth: Payer: Self-pay | Admitting: Student

## 2017-03-11 NOTE — Telephone Encounter (Signed)
Received records from [facility] for appointment on 04/09/17 with Bernerd Pho, [place].  Records put with Brittany's schedule for 04/09/17. lp

## 2017-04-08 NOTE — Progress Notes (Deleted)
Cardiology Office Note    Date:  04/08/2017   ID:  Norman Sims, DOB [birth year], MRN [id number]  PCP:  Norman Infante, MD  Cardiologist: Dr. Stanford Sims  No chief complaint on file.   History of Present Illness:    Norman Sims is a 56 y.o. male with past medical history of PAF (s/p DCCV in 06/2013 and 10/2013), OSA (on CPAP), HTN, and HLD who presents to the office today for 1.5 year follow-up.   He was last examined by Dr. Stanford Sims in 08/2015 and reported doing well from a cardiac perspective at that time. He denied any recent chest pain, palpitations, or dyspnea. He was continued on Flecainide, Lopressor, and Cardizem. It was recommended he be on long-term anticoagulation but it was not initiated at that time.     Past Medical History:  Diagnosis Date  . Atrial fibrillation ([place])   . Chronic kidney disease    past hx kidney stones  . Colon polyp   . Depression   . GERD (gastroesophageal reflux disease)   . Hyperlipidemia   . Hypertension 2008  . Sleep apnea    wears cpap    Past Surgical History:  Procedure Laterality Date  . APPENDECTOMY    . CARDIOVERSION N/A 07/29/2013   Procedure: CARDIOVERSION;  Surgeon: Sueanne Margarita, MD;  Location: [facility];  Service: Cardiovascular;  Laterality: N/A;  . CARDIOVERSION N/A 11/01/2013   Procedure: CARDIOVERSION;  Surgeon: Sueanne Margarita, MD;  Location: [facility];  Service: Cardiovascular;  Laterality: N/A;  . COLONOSCOPY    . POLYPECTOMY      Current Medications: Outpatient Medications Prior to Visit  Medication Sig Dispense Refill  . ARIPiprazole (ABILIFY) 2 MG tablet Take 2 mg by mouth daily.    Marland Kitchen aspirin 81 MG tablet Take 81 mg by mouth daily.    Marland Kitchen atorvastatin (LIPITOR) 80 MG tablet Take 1 tablet by mouth daily.    . cholecalciferol (VITAMIN D) 1000 UNITS tablet Take 1,000 Units by mouth every morning.    . diltiazem (TIAZAC) 360 MG 24 hr capsule Take 1 capsule (360 mg total) by mouth daily. 90 capsule 1    . fish oil-omega-3 fatty acids 1000 MG capsule Take 3,000 mg by mouth every morning.     . metoprolol succinate (TOPROL-XL) 50 MG 24 hr tablet Take 1 tablet (50 mg total) by mouth daily. 90 tablet 1  . Omeprazole-Sodium Bicarbonate (ZEGERID) 20-1100 MG CAPS capsule Take 1 capsule by mouth daily.     Marland Kitchen PARoxetine (PAXIL) 20 MG tablet Take 30 mg by mouth daily.    . traZODone (DESYREL) 100 MG tablet Take 100 mg by mouth at bedtime.    . valACYclovir (VALTREX) 500 MG tablet Take 500 mg by mouth daily as needed (for an occurance). Reported on 11/21/2015     No facility-administered medications prior to visit.      Allergies:   Patient has no known allergies.   Social History   Socioeconomic History  . Marital status: Married    Spouse name: Not on file  . Number of children: 2  . Years of education: Not on file  . Highest education level: Not on file  Social Needs  . Financial resource strain: Not on file  . Food insecurity - worry: Not on file  . Food insecurity - inability: Not on file  . Transportation needs - medical: Not on file  . Transportation needs - non-medical: Not on file  Occupational History  .  Occupation: Statistician: [employer]    Comment: Clinical biochemist   Tobacco Use  . Smoking status: Former Smoker    Years: 3.00    Types: Cigars    Last attempt to quit: 09/07/2011    Years since quitting: 5.5  . Smokeless tobacco: Never Used  Substance and Sexual Activity  . Alcohol use: Yes    Alcohol/week: 21.0 oz    Types: 7 Glasses of wine, 28 Cans of beer per week    Comment: 2 glasses of wine or mixed drink every 3 days  . Drug use: No  . Sexual activity: Not on file  Other Topics Concern  . Not on file  Social History Narrative  . Not on file     Family History:  The patient's ***family history includes Allergies in his father and sister; CAD in his father and unknown relative; Diabetes in his paternal aunt and paternal  uncle; Hypertension in his father; Lung cancer in his father; Pneumonia in his mother.   Review of Systems:   Please see the history of present illness.     General:  No chills, fever, night sweats or weight changes.  Cardiovascular:  No chest pain, dyspnea on exertion, edema, orthopnea, palpitations, paroxysmal nocturnal dyspnea. Dermatological: No rash, lesions/masses Respiratory: No cough, dyspnea Urologic: No hematuria, dysuria Abdominal:   No nausea, vomiting, diarrhea, bright red blood per rectum, melena, or hematemesis Neurologic:  No visual changes, wkns, changes in mental status. All other systems reviewed and are otherwise negative except as noted above.   Physical Exam:    VS:  There were no vitals taken for this visit.   General: Well developed, well nourished,male appearing in no acute distress. Head: Normocephalic, atraumatic, sclera non-icteric, no xanthomas, nares are without discharge.  Neck: No carotid bruits. JVD not elevated.  Lungs: Respirations regular and unlabored, without wheezes or rales.  Heart: ***Regular rate and rhythm. No S3 or S4.  No murmur, no rubs, or gallops appreciated. Abdomen: Soft, non-tender, non-distended with normoactive bowel sounds. No hepatomegaly. No rebound/guarding. No obvious abdominal masses. Msk:  Strength and tone appear normal for age. No joint deformities or effusions. Extremities: No clubbing or cyanosis. No edema.  Distal pedal pulses are 2+ bilaterally. Neuro: Alert and oriented X 3. Moves all extremities spontaneously. No focal deficits noted. Psych:  Responds to questions appropriately with a normal affect. Skin: No rashes or lesions noted  Wt Readings from Last 3 Encounters:  09/04/15 (!) 300 lb 9.6 oz (136.4 kg)  06/01/15 300 lb (136.1 kg)  01/06/15 279 lb 14.4 oz (127 kg)        Studies/Labs Reviewed:   EKG:  EKG is*** ordered today.  The ekg ordered today demonstrates ***  Recent Labs: No results found for  requested labs within last 8760 hours.   Lipid Panel    Component Value Date/Time   CHOL 145 08/07/2015 1437   TRIG 155 (H) 08/07/2015 1437   HDL 40 08/07/2015 1437   CHOLHDL 3.6 08/07/2015 1437   VLDL 31 (H) 08/07/2015 1437   LDLCALC 74 08/07/2015 1437    Additional studies/ records that were reviewed today include:   Echocardiogram: 06/2015 Study Conclusions  - Left ventricle: The cavity size was normal. Wall thickness was   increased in a pattern of mild LVH. Systolic function was normal.   The estimated ejection fraction was in the range of 55% to 60%.   Left ventricular diastolic function parameters were  normal. - Aortic valve: There was trivial regurgitation. - Left atrium: The atrium was mildly dilated. - Pulmonary arteries: PA peak pressure: 37 mm Hg (S).  Assessment:    No diagnosis found.   Plan:   In order of problems listed above:  1. ***    Medication Adjustments/Labs and Tests Ordered: Current medicines are reviewed at length with the patient today.  Concerns regarding medicines are outlined above.  Medication changes, Labs and Tests ordered today are listed in the Patient Instructions below. There are no Patient Instructions on file for this visit.   Signed, Erma Heritage, PA-C  04/08/2017 6:19 PM    [facility] [place], [place], [place]  [address] Phone: [phone]; Fax: [phone]  [address] [place], [place] [address] Phone: [phone]

## 2017-04-09 ENCOUNTER — Ambulatory Visit: Payer: Self-pay | Admitting: Student

## 2017-08-11 ENCOUNTER — Encounter: Payer: Self-pay | Admitting: Gastroenterology

## 2022-03-06 ENCOUNTER — Emergency Department (HOSPITAL_COMMUNITY)
Admission: EM | Admit: 2022-03-06 | Discharge: 2022-03-06 | Disposition: A | Discharge status: Home / Self Care | Payer: No Typology Code available for payment source | Attending: Emergency Medicine | Admitting: Emergency Medicine

## 2022-03-06 ENCOUNTER — Other Ambulatory Visit: Payer: Self-pay

## 2022-03-06 ENCOUNTER — Encounter (HOSPITAL_COMMUNITY): Payer: Self-pay

## 2022-03-06 DIAGNOSIS — E860 Dehydration: Secondary | ICD-10-CM | POA: Insufficient documentation

## 2022-03-06 DIAGNOSIS — Z7982 Long term (current) use of aspirin: Secondary | ICD-10-CM | POA: Diagnosis not present

## 2022-03-06 DIAGNOSIS — Z79899 Other long term (current) drug therapy: Secondary | ICD-10-CM | POA: Insufficient documentation

## 2022-03-06 DIAGNOSIS — I10 Essential (primary) hypertension: Secondary | ICD-10-CM | POA: Diagnosis not present

## 2022-03-06 DIAGNOSIS — E876 Hypokalemia: Secondary | ICD-10-CM | POA: Insufficient documentation

## 2022-03-06 DIAGNOSIS — I4891 Unspecified atrial fibrillation: Secondary | ICD-10-CM | POA: Insufficient documentation

## 2022-03-06 DIAGNOSIS — R112 Nausea with vomiting, unspecified: Secondary | ICD-10-CM | POA: Diagnosis present

## 2022-03-06 LAB — URINALYSIS, ROUTINE W REFLEX MICROSCOPIC
Bacteria, UA: NONE SEEN
Bilirubin Urine: NEGATIVE
Glucose, UA: 50 mg/dL — AB
Hgb urine dipstick: NEGATIVE
Ketones, ur: 80 mg/dL — AB
Leukocytes,Ua: NEGATIVE
Nitrite: NEGATIVE
Protein, ur: 100 mg/dL — AB
Specific Gravity, Urine: 1.012 (ref 1.005–1.030)
pH: 8 (ref 5.0–8.0)

## 2022-03-06 LAB — BASIC METABOLIC PANEL
Anion gap: 13 (ref 5–15)
BUN: 12 mg/dL (ref 8–23)
CO2: 21 mmol/L — ABNORMAL LOW (ref 22–32)
Calcium: 9.3 mg/dL (ref 8.9–10.3)
Chloride: 102 mmol/L (ref 98–111)
Creatinine, Ser: 0.73 mg/dL (ref 0.61–1.24)
GFR, Estimated: >60 mL/min (ref >60)
Glucose, Bld: 139 mg/dL — ABNORMAL HIGH (ref 70–99)
Potassium: 2.9 mmol/L — ABNORMAL LOW (ref 3.5–5.1)
Sodium: 136 mmol/L (ref 135–145)

## 2022-03-06 LAB — TROPONIN I (HIGH SENSITIVITY)
Troponin I (High Sensitivity): 6 ng/L (ref <18)
Troponin I (High Sensitivity): 7 ng/L (ref <18)

## 2022-03-06 LAB — CBC
HCT: 48 % (ref 39.0–52.0)
Hemoglobin: 16.4 g/dL (ref 13.0–17.0)
MCH: 30.7 pg (ref 26.0–34.0)
MCHC: 34.2 g/dL (ref 30.0–36.0)
MCV: 89.9 fL (ref 80.0–100.0)
Platelets: 236 10*3/uL (ref 150–400)
RBC: 5.34 MIL/uL (ref 4.22–5.81)
RDW: 14.1 % (ref 11.5–15.5)
WBC: 10.5 10*3/uL (ref 4.0–10.5)
nRBC: 0 % (ref 0.0–0.2)

## 2022-03-06 MED ORDER — POTASSIUM CHLORIDE 20 MEQ PO PACK
40.0000 meq | PACK | Freq: Once | ORAL | Status: AC
  Administered 2022-03-06: 40 meq via ORAL
  Filled 2022-03-06: qty 2

## 2022-03-06 MED ORDER — POTASSIUM CHLORIDE 10 MEQ/100ML IV SOLN
10.0000 meq | INTRAVENOUS | Status: AC
  Administered 2022-03-06 (×2): 10 meq via INTRAVENOUS
  Filled 2022-03-06: qty 100

## 2022-03-06 MED ORDER — ONDANSETRON HCL 4 MG/2ML IJ SOLN
4.0000 mg | Freq: Once | INTRAMUSCULAR | Status: AC
  Administered 2022-03-06: 4 mg via INTRAVENOUS
  Filled 2022-03-06: qty 2

## 2022-03-06 MED ORDER — SODIUM CHLORIDE 0.9 % IV BOLUS
1000.0000 mL | Freq: Once | INTRAVENOUS | Status: AC
  Administered 2022-03-06: 1000 mL via INTRAVENOUS

## 2022-03-06 MED ORDER — LORAZEPAM 2 MG/ML IJ SOLN
1.0000 mg | Freq: Once | INTRAMUSCULAR | Status: AC
  Administered 2022-03-06: 1 mg via INTRAVENOUS
  Filled 2022-03-06: qty 1

## 2022-03-06 NOTE — ED Triage Notes (Signed)
CC: lightheaded, nausea, weakness, HTN  Pt had colonoscopy this morning. Pt left outpatient with elevated BP. Pt took a half of BP med before procedure and half after. Pt got home and began to feel lightheaded, nauseous, and weak. Pt reports he was advised not to take his A. Fib medication this morning.

## 2022-03-06 NOTE — ED Provider Notes (Signed)
[facility] EMERGENCY DEPARTMENT Provider Note   CSN: [id number] Arrival date & time: 03/06/22  1211     History  Chief Complaint  Patient presents with   Hypertension   Weakness   Dizziness   Nausea    Norman Sims is a 61 y.o. male with a past medical history significant for hypertension, hyperlipidemia, persistent A-fib, depression, sleep apnea, obesity, acid reflux who presents with concern for lightheadedness, nausea, weakness, and uncontrolled hypertension after his colonoscopy appointment earlier today.  Patient was told to not take his normal A-fib medications, when I asked him to clarify he reports that he has not taken any of his diltiazem 360 mg, but did take half of his metoprolol before the procedure and the other half afterwards.  Patient reports he is also not taking his psych medications, he has documented previous history of Paxil, trazodone, and Abilify, however patient reports that he is not taking these at this time, is currently taking duloxetine.  Patient denies any chest pain, shortness of breath, vision changes, unilateral weakness, numbness.   Hypertension  Weakness Associated symptoms: dizziness and nausea   Dizziness Associated symptoms: nausea and weakness        Home Medications Prior to Admission medications   Medication Sig Start Date End Date Taking? Authorizing Provider  ARIPiprazole (ABILIFY) 20 MG tablet Take 10 mg by mouth daily.   Yes [provider]  atorvastatin (LIPITOR) 80 MG tablet Take 1 tablet by mouth daily. 01/03/15  Yes [provider]  diltiazem (TIAZAC) 360 MG 24 hr capsule Take 1 capsule (360 mg total) by mouth daily. 11/29/15  Yes Lelon Perla, MD  DULoxetine (CYMBALTA) 60 MG capsule Take 120 mg by mouth daily.   Yes [provider]  metoprolol succinate (TOPROL-XL) 50 MG 24 hr tablet Take 1 tablet (50 mg total) by mouth daily. Patient taking differently: Take 25 mg by mouth 2 (two) times  daily. 07/20/15  Yes Lelon Perla, MD  omeprazole (PRILOSEC) 20 MG capsule Take 20 mg by mouth daily.   Yes [provider]  aspirin 81 MG tablet Take 81 mg by mouth daily.    [provider]  fish oil-omega-3 fatty acids 1000 MG capsule Take 3,000 mg by mouth every morning.     [provider]      Allergies    Patient has no known allergies.    Review of Systems   Review of Systems  Gastrointestinal:  Positive for nausea.  Neurological:  Positive for dizziness and weakness.  All other systems reviewed and are negative.   Physical Exam Updated Vital Signs BP (!) 194/112   Pulse (!) 53   Temp 98.7 F (37.1 C) (Oral)   Resp 11   Ht 6' (1.829 m)   Wt 136.1 kg   SpO2 96%   BMI 40.69 kg/m  Physical Exam Vitals and nursing note reviewed.  Constitutional:      General: He is not in acute distress.    Appearance: Normal appearance. He is diaphoretic.  HENT:     Head: Normocephalic and atraumatic.  Eyes:     General:        Right eye: No discharge.        Left eye: No discharge.  Cardiovascular:     Rate and Rhythm: Tachycardia present. Rhythm irregular.     Heart sounds: No murmur heard.    No friction rub. No gallop.  Pulmonary:     Effort: Pulmonary effort  is normal.     Breath sounds: Normal breath sounds.  Abdominal:     General: Bowel sounds are normal.     Palpations: Abdomen is soft.     Comments: No significant tenderness to palpation of the abdomen  Skin:    General: Skin is warm.     Capillary Refill: Capillary refill takes less than 2 seconds.  Neurological:     Mental Status: He is alert and oriented to person, place, and time.     Comments: Cranial nerves II through XII grossly intact.  Intact finger-nose, intact heel-to-shin.  Romberg negative, gait normal.  Alert and oriented x3.  Moves all 4 limbs spontaneously, normal coordination.  No pronator drift.  Intact strength 5 out of 5 bilateral upper and lower extremities.     Psychiatric:        Mood and Affect: Mood normal.        Behavior: Behavior normal.     ED Results / Procedures / Treatments   Labs (all labs ordered are listed, but only abnormal results are displayed) Labs Reviewed  BASIC METABOLIC PANEL - Abnormal; Notable for the following components:      Result Value   Potassium 2.9 (*)    CO2 21 (*)    Glucose, Bld 139 (*)    All other components within normal limits  URINALYSIS, ROUTINE W REFLEX MICROSCOPIC - Abnormal; Notable for the following components:   Color, Urine STRAW (*)    Glucose, UA 50 (*)    Ketones, ur 80 (*)    Protein, ur 100 (*)    All other components within normal limits  CBC  TROPONIN I (HIGH SENSITIVITY)  TROPONIN I (HIGH SENSITIVITY)    EKG None  Radiology No results found.  Procedures Procedures    Medications Ordered in ED Medications  sodium chloride 0.9 % bolus 1,000 mL (0 mLs Intravenous Stopped 03/06/22 1512)  potassium chloride (KLOR-CON) packet 40 mEq (40 mEq Oral Given 03/06/22 1412)  potassium chloride 10 mEq in 100 mL IVPB (0 mEq Intravenous Stopped 03/06/22 1635)  ondansetron (ZOFRAN) injection 4 mg (4 mg Intravenous Given 03/06/22 1412)  LORazepam (ATIVAN) injection 1 mg (1 mg Intravenous Given 03/06/22 1749)    ED Course/ Medical Decision Making/ A&P Clinical Course as of 03/06/22 1933  Wed Mar 06, 2022  1504 Patient will take his home diltiazem, and depression/anxiety medication [CP]    Clinical Course User Index [CP] Anselmo Pickler, PA-C                           Medical Decision Making Amount and/or Complexity of Data Reviewed Labs: ordered.  Risk Prescription drug management.   This patient is a 61 y.o. male who presents to the ED for concern of diaphoresis, lightheadedness, nauseous, weakness, blood pressure dysfunction after colonoscopy earlier today, this involves an extensive number of treatment options, and is a complaint that carries with it a high risk of  complications and morbidity. The emergent differential diagnosis prior to evaluation includes, but is not limited to, complication from colonoscopy.   This is not an exhaustive differential.   Past Medical History / Co-morbidities / Social History: hypertension, hyperlipidemia, persistent A-fib, depression, sleep apnea, obesity, acid reflux   Additional history: Chart reviewed. Pertinent results include: Reviewed outpatient cardiology, family medicine, lab work, imaging from previous emergency department visits  Physical Exam: Physical exam performed. The pertinent findings include: Patient is mildly tachycardic, quite anxious,  he is hypertensive and with A-fib with alternating rate, patient going from normal rate to A-fib RVR and back down to a normal rate intermittently.  He is not having tenderness to palpation of the abdomen  Lab Tests: I ordered, and personally interpreted labs.  The pertinent results include: Moderate hypokalemia, potassium 2.9, we will orally replete and give some IV replacement as well, glucose mildly elevated 139, troponin negative x2 in context of no active chest pain, CBC unremarkable, UA with some ketones protein suggestive of mild dehydration.   Cardiac Monitoring:  The patient was maintained on a cardiac monitor.  My attending physician Dr. Matilde Sprang viewed and interpreted the cardiac monitored which showed an underlying rhythm of: AFIB RVR. I agree with this interpretation.   Medications: I ordered medication including Ativan, potassium chloride, Zofran, and encourage patient to take his home diltiazem as well as depression and anxiety medication diltiazem 360 mg extended release at home. Reevaluation of the patient after these medicines showed that the patient improved. I have reviewed the patients home medicines and have made adjustments as needed.  Patient still does feel somewhat weak and anxious, and suspicious that he is having some dehydration, potassium  dysfunction, as well as feeling some symptomatic response from his A-fib, he has known history of A-fib, medically managed   Disposition: After consideration of the diagnostic results and the patients response to treatment, I feel that patient stable for discharge at this time, I think that he is having some dysfunction secondary to his A-fib, dehydration, hypokalemia, his blood pressure continues to be somewhat labile but his pulses somewhat improved at time of discharge with heart rate of 100, he remained stable on room air as far as oxygenation, and we discussed further work-up versus patient going home to rest and seeing if his symptoms improve, he is amenable to going home and resting, following up early in the morning with his cardiologist and other providers.   emergency department workup does not suggest an emergent condition requiring admission or immediate intervention beyond what has been performed at this time. The plan is: as above. The patient is safe for discharge and has been instructed to return immediately for worsening symptoms, change in symptoms or any other concerns.  I discussed this case with my attending physician Dr. Matilde Sprang who cosigned this note including patient's presenting symptoms, physical exam, and planned diagnostics and interventions. Attending physician stated agreement with plan or made changes to plan which were implemented.    Final Clinical Impression(s) / ED Diagnoses Final diagnoses:  Hypertension, unspecified type  Atrial fibrillation, unspecified type ([place])  Dehydration  Hypokalemia    Rx / DC Orders ED Discharge Orders     None         Dorien Chihuahua 03/06/22 1933    Teressa Lower, MD 03/13/22 [phone]

## 2022-03-06 NOTE — Discharge Instructions (Signed)
Please return to normal doses of your home medications and follow-up with your primary care provider as well as the provider performed her colonoscopy at your earliest convenience to ensure your symptoms are improving. if you experience persistent or worsening chest pain, dizziness, nausea, shortness of breath, rectal bleeding, severe abdominal pain recommend returning for further evaluation.

## 2024-02-12 DIAGNOSIS — F3341 Major depressive disorder, recurrent, in partial remission: Secondary | ICD-10-CM | POA: Diagnosis not present

## 2024-02-12 DIAGNOSIS — R7303 Prediabetes: Secondary | ICD-10-CM | POA: Diagnosis not present

## 2024-02-12 DIAGNOSIS — G4733 Obstructive sleep apnea (adult) (pediatric): Secondary | ICD-10-CM | POA: Diagnosis not present

## 2024-02-12 DIAGNOSIS — E785 Hyperlipidemia, unspecified: Secondary | ICD-10-CM | POA: Diagnosis not present

## 2024-02-12 DIAGNOSIS — I10 Essential (primary) hypertension: Secondary | ICD-10-CM | POA: Diagnosis not present

## 2024-02-12 DIAGNOSIS — Z6841 Body Mass Index (BMI) 40.0 and over, adult: Secondary | ICD-10-CM | POA: Diagnosis not present

## 2024-02-12 DIAGNOSIS — Z008 Encounter for other general examination: Secondary | ICD-10-CM | POA: Diagnosis not present

## 2024-02-12 DIAGNOSIS — I4891 Unspecified atrial fibrillation: Secondary | ICD-10-CM | POA: Diagnosis not present
